# Patient Record
Sex: Male | Born: 1950 | Hispanic: Yes | Smoking: Former smoker
Health system: Southern US, Community
[De-identification: ages and names within clinical notes are randomized; demographics above are authoritative.]

## PROBLEM LIST (undated history)

## (undated) DIAGNOSIS — I639 Cerebral infarction, unspecified: Secondary | ICD-10-CM

## (undated) DIAGNOSIS — R51 Headache: Secondary | ICD-10-CM

## (undated) DIAGNOSIS — I2510 Atherosclerotic heart disease of native coronary artery without angina pectoris: Secondary | ICD-10-CM

## (undated) DIAGNOSIS — E119 Type 2 diabetes mellitus without complications: Secondary | ICD-10-CM

## (undated) DIAGNOSIS — I10 Essential (primary) hypertension: Secondary | ICD-10-CM

## (undated) DIAGNOSIS — E7800 Pure hypercholesterolemia, unspecified: Secondary | ICD-10-CM

## (undated) DIAGNOSIS — R519 Headache, unspecified: Secondary | ICD-10-CM

## (undated) DIAGNOSIS — N186 End stage renal disease: Secondary | ICD-10-CM

## (undated) DIAGNOSIS — K219 Gastro-esophageal reflux disease without esophagitis: Secondary | ICD-10-CM

## (undated) DIAGNOSIS — Z992 Dependence on renal dialysis: Secondary | ICD-10-CM

## (undated) HISTORY — PX: EYE SURGERY: SHX253

## (undated) HISTORY — PX: LIGATION OF ARTERIOVENOUS  FISTULA: SHX5948

## (undated) HISTORY — PX: APPENDECTOMY: SHX54

## (undated) HISTORY — PX: FINGER AMPUTATION: SHX636

## (undated) HISTORY — PX: AV FISTULA PLACEMENT: SHX1204

---

## 2013-07-10 HISTORY — PX: CARDIAC CATHETERIZATION: SHX172

## 2014-10-09 HISTORY — PX: CORONARY ARTERY BYPASS GRAFT: SHX141

## 2016-07-10 HISTORY — PX: INSERTION OF DIALYSIS CATHETER: SHX1324

## 2016-12-08 DIAGNOSIS — I639 Cerebral infarction, unspecified: Secondary | ICD-10-CM

## 2016-12-08 HISTORY — DX: Cerebral infarction, unspecified: I63.9

## 2018-03-04 ENCOUNTER — Encounter (HOSPITAL_COMMUNITY): Payer: Self-pay | Admitting: Emergency Medicine

## 2018-03-04 ENCOUNTER — Inpatient Hospital Stay (HOSPITAL_COMMUNITY)
Admission: EM | Admit: 2018-03-04 | Discharge: 2018-03-07 | DRG: 205 | Disposition: A | Discharge status: Home / Self Care | Payer: Medicare Other | Attending: Internal Medicine | Admitting: Internal Medicine

## 2018-03-04 ENCOUNTER — Emergency Department (HOSPITAL_COMMUNITY): Payer: Medicare Other

## 2018-03-04 ENCOUNTER — Other Ambulatory Visit: Payer: Self-pay

## 2018-03-04 DIAGNOSIS — G4733 Obstructive sleep apnea (adult) (pediatric): Secondary | ICD-10-CM | POA: Diagnosis present

## 2018-03-04 DIAGNOSIS — E1151 Type 2 diabetes mellitus with diabetic peripheral angiopathy without gangrene: Secondary | ICD-10-CM | POA: Diagnosis present

## 2018-03-04 DIAGNOSIS — L97529 Non-pressure chronic ulcer of other part of left foot with unspecified severity: Secondary | ICD-10-CM | POA: Diagnosis present

## 2018-03-04 DIAGNOSIS — L8990 Pressure ulcer of unspecified site, unspecified stage: Secondary | ICD-10-CM

## 2018-03-04 DIAGNOSIS — M940 Chondrocostal junction syndrome [Tietze]: Principal | ICD-10-CM | POA: Diagnosis present

## 2018-03-04 DIAGNOSIS — E1142 Type 2 diabetes mellitus with diabetic polyneuropathy: Secondary | ICD-10-CM | POA: Diagnosis present

## 2018-03-04 DIAGNOSIS — E11649 Type 2 diabetes mellitus with hypoglycemia without coma: Secondary | ICD-10-CM | POA: Diagnosis not present

## 2018-03-04 DIAGNOSIS — Z794 Long term (current) use of insulin: Secondary | ICD-10-CM

## 2018-03-04 DIAGNOSIS — I120 Hypertensive chronic kidney disease with stage 5 chronic kidney disease or end stage renal disease: Secondary | ICD-10-CM | POA: Diagnosis present

## 2018-03-04 DIAGNOSIS — D631 Anemia in chronic kidney disease: Secondary | ICD-10-CM | POA: Diagnosis present

## 2018-03-04 DIAGNOSIS — Z951 Presence of aortocoronary bypass graft: Secondary | ICD-10-CM

## 2018-03-04 DIAGNOSIS — R072 Precordial pain: Secondary | ICD-10-CM

## 2018-03-04 DIAGNOSIS — L97519 Non-pressure chronic ulcer of other part of right foot with unspecified severity: Secondary | ICD-10-CM | POA: Diagnosis present

## 2018-03-04 DIAGNOSIS — I2510 Atherosclerotic heart disease of native coronary artery without angina pectoris: Secondary | ICD-10-CM | POA: Diagnosis present

## 2018-03-04 DIAGNOSIS — K5900 Constipation, unspecified: Secondary | ICD-10-CM | POA: Diagnosis present

## 2018-03-04 DIAGNOSIS — I70219 Atherosclerosis of native arteries of extremities with intermittent claudication, unspecified extremity: Secondary | ICD-10-CM

## 2018-03-04 DIAGNOSIS — N186 End stage renal disease: Secondary | ICD-10-CM | POA: Diagnosis present

## 2018-03-04 DIAGNOSIS — E1122 Type 2 diabetes mellitus with diabetic chronic kidney disease: Secondary | ICD-10-CM | POA: Diagnosis present

## 2018-03-04 DIAGNOSIS — Z87891 Personal history of nicotine dependence: Secondary | ICD-10-CM

## 2018-03-04 DIAGNOSIS — E11621 Type 2 diabetes mellitus with foot ulcer: Secondary | ICD-10-CM | POA: Diagnosis present

## 2018-03-04 DIAGNOSIS — Z992 Dependence on renal dialysis: Secondary | ICD-10-CM

## 2018-03-04 DIAGNOSIS — R296 Repeated falls: Secondary | ICD-10-CM | POA: Diagnosis present

## 2018-03-04 DIAGNOSIS — N2581 Secondary hyperparathyroidism of renal origin: Secondary | ICD-10-CM | POA: Diagnosis present

## 2018-03-04 DIAGNOSIS — Z79899 Other long term (current) drug therapy: Secondary | ICD-10-CM

## 2018-03-04 DIAGNOSIS — R0789 Other chest pain: Secondary | ICD-10-CM

## 2018-03-04 HISTORY — DX: End stage renal disease: N18.6

## 2018-03-04 HISTORY — DX: Essential (primary) hypertension: I10

## 2018-03-04 HISTORY — DX: Headache, unspecified: R51.9

## 2018-03-04 HISTORY — DX: Atherosclerotic heart disease of native coronary artery without angina pectoris: I25.10

## 2018-03-04 HISTORY — DX: Gastro-esophageal reflux disease without esophagitis: K21.9

## 2018-03-04 HISTORY — DX: Dependence on renal dialysis: Z99.2

## 2018-03-04 HISTORY — DX: Headache: R51

## 2018-03-04 HISTORY — DX: Pure hypercholesterolemia, unspecified: E78.00

## 2018-03-04 HISTORY — DX: Cerebral infarction, unspecified: I63.9

## 2018-03-04 HISTORY — DX: Type 2 diabetes mellitus without complications: E11.9

## 2018-03-04 LAB — CBC WITH DIFFERENTIAL/PLATELET
Abs Immature Granulocytes: 0.1 10*3/uL (ref 0.0–0.1)
Basophils Absolute: 0.1 10*3/uL (ref 0.0–0.1)
Basophils Relative: 1 %
EOS ABS: 0.2 10*3/uL (ref 0.0–0.7)
EOS PCT: 3 %
HCT: 36.8 % — ABNORMAL LOW (ref 39.0–52.0)
Hemoglobin: 10.7 g/dL — ABNORMAL LOW (ref 13.0–17.0)
IMMATURE GRANULOCYTES: 1 %
Lymphocytes Relative: 16 %
Lymphs Abs: 1.1 10*3/uL (ref 0.7–4.0)
MCH: 25.2 pg — ABNORMAL LOW (ref 26.0–34.0)
MCHC: 29.1 g/dL — AB (ref 30.0–36.0)
MCV: 86.8 fL (ref 78.0–100.0)
MONOS PCT: 10 %
Monocytes Absolute: 0.7 10*3/uL (ref 0.1–1.0)
NEUTROS PCT: 69 %
Neutro Abs: 4.6 10*3/uL (ref 1.7–7.7)
PLATELETS: 196 10*3/uL (ref 150–400)
RBC: 4.24 MIL/uL (ref 4.22–5.81)
RDW: 20.5 % — ABNORMAL HIGH (ref 11.5–15.5)
WBC: 6.6 10*3/uL (ref 4.0–10.5)

## 2018-03-04 LAB — BASIC METABOLIC PANEL
Anion gap: 20 — ABNORMAL HIGH (ref 5–15)
BUN: 53 mg/dL — ABNORMAL HIGH (ref 8–23)
CO2: 31 mmol/L (ref 22–32)
CREATININE: 8.72 mg/dL — AB (ref 0.61–1.24)
Calcium: 8.2 mg/dL — ABNORMAL LOW (ref 8.9–10.3)
Chloride: 87 mmol/L — ABNORMAL LOW (ref 98–111)
GFR calc non Af Amer: 6 mL/min — ABNORMAL LOW (ref >60)
GFR, EST AFRICAN AMERICAN: 6 mL/min — AB (ref >60)
Glucose, Bld: 186 mg/dL — ABNORMAL HIGH (ref 70–99)
Potassium: 4.8 mmol/L (ref 3.5–5.1)
SODIUM: 138 mmol/L (ref 135–145)

## 2018-03-04 LAB — I-STAT TROPONIN, ED
TROPONIN I, POC: 0.03 ng/mL (ref 0.00–0.08)
Troponin i, poc: 0.01 ng/mL (ref 0.00–0.08)

## 2018-03-04 MED ORDER — CINACALCET HCL 30 MG PO TABS
90.0000 mg | ORAL_TABLET | Freq: Every day | ORAL | Status: DC
  Administered 2018-03-05 – 2018-03-06 (×2): 90 mg via ORAL
  Filled 2018-03-04 (×3): qty 3

## 2018-03-04 MED ORDER — HYDRALAZINE HCL 25 MG PO TABS
25.0000 mg | ORAL_TABLET | Freq: Three times a day (TID) | ORAL | Status: DC
  Administered 2018-03-05 – 2018-03-07 (×8): 25 mg via ORAL
  Filled 2018-03-04 (×9): qty 1

## 2018-03-04 MED ORDER — ATORVASTATIN CALCIUM 20 MG PO TABS: 80.0000 mg | ORAL_TABLET | Freq: Every day | ORAL | Status: DC

## 2018-03-04 MED ORDER — CARVEDILOL 12.5 MG PO TABS
6.2500 mg | ORAL_TABLET | Freq: Two times a day (BID) | ORAL | Status: DC
  Administered 2018-03-05: 6.25 mg via ORAL
  Filled 2018-03-04: qty 1

## 2018-03-04 MED ORDER — ASPIRIN 81 MG PO CHEW
324.0000 mg | CHEWABLE_TABLET | Freq: Once | ORAL | Status: AC
  Administered 2018-03-04: 324 mg via ORAL
  Filled 2018-03-04: qty 4

## 2018-03-04 MED ORDER — SEVELAMER CARBONATE 800 MG PO TABS
2400.0000 mg | ORAL_TABLET | Freq: Three times a day (TID) | ORAL | Status: DC
  Administered 2018-03-05 – 2018-03-07 (×7): 2400 mg via ORAL
  Filled 2018-03-04 (×8): qty 3

## 2018-03-04 NOTE — ED Provider Notes (Signed)
Patient placed in Quick Look pathway, seen and evaluated   Chief Complaint: Chest pain, generalized weakness, fatigue, shortness of breath  HPI:   67 y.o. M who presents for evaluation of multiple complaints, including generalized weakness, shortness of breath, chest pain that is been ongoing for last 2 weeks.  Family reports that symptoms have worsened over the last few days.  They report that he just feels tired and "not feeling well."  Patient is a Tuesday, Thursday, Saturday dialysis patient.  He reports his last dialysis was 2 days ago.  No issues.  Patient also reports nausea/vomiting.  Denies any abdominal pain.  He does not make urine.  Denies any fevers, chills.  ROS: Pain, generalized weakness, fatigue, shortness of breath.  Physical Exam:   Gen: No distress  Neuro: Awake and Alert  Skin: Warm    Focused Exam: Regular rate and rhythm, fine crackles noted.  Abdomen is soft, nondistended, nontender.   Initiation of care has begun. The patient has been counseled on the process, plan, and necessity for staying for the completion/evaluation, and the remainder of the medical screening examination    Maxwell CaulLayden, Lindsey A, PA-C 03/04/18 1845    Rolan BuccoBelfi, Melanie, MD 03/04/18 1919

## 2018-03-04 NOTE — ED Provider Notes (Signed)
Patient is a 67 year old male with significant cardiac risk factors who presents with chest pain.  He has chronic pain but his symptoms today are worse than his baseline pain.  His EKG shows some T wave inversion laterally.  We have no old EKG for comparison.  Consult has been made with the cardiology fellow who recommends medicine admission.  I saw and evaluated the patient, reviewed the resident's note and I agree with the findings and plan.  EKG: EKG Interpretation  Date/Time:  Monday March 04 2018 18:34:32 EDT Ventricular Rate:  59 PR Interval:  136 QRS Duration: 82 QT Interval:  478 QTC Calculation: 473 R Axis:   11 Text Interpretation:  Sinus bradycardia ST & T wave abnormality, consider lateral ischemia Abnormal ECG Confirmed by Rolan BuccoBelfi, Tamas Suen [phone]([id number]) on 03/04/2018 9:23:51 PM     Rolan BuccoBelfi, Emmett Arntz, MD 03/04/18 2255

## 2018-03-04 NOTE — ED Triage Notes (Signed)
Patient to ED c/o generalized weakness, shortness of breath, and non-radiating L-sided CP x 2 weeks, progressively worsening since onset. Pt hx ESRD on dialysis T/TH/Sa (last had on Saturday with no problems), open heart surgery 4 years ago, HTN, DM. Also endorsing N/V. Denies fevers/chills.

## 2018-03-04 NOTE — ED Provider Notes (Signed)
[facility] EMERGENCY DEPARTMENT Provider Note   CSN: [id number] Arrival date & time: 03/04/18  1824     History   Chief Complaint Chief Complaint  Patient presents with  . Chest Pain  . Fatigue    HPI Todd Gill is a 67 y.o. male.  The history is provided by the patient. The history is limited by a language barrier. A language interpreter was used Banker interpreter: Trinna Post).  Chest Pain   This is a chronic problem. Episode onset: 2 months ago. The problem occurs constantly. The problem has been rapidly worsening (over last day). The pain is severe. Quality: pounding. The pain does not radiate. Duration of episode(s) is 1 day. Associated symptoms include nausea and shortness of breath. Pertinent negatives include no abdominal pain, no cough, no fever, no palpitations, no sputum production and no vomiting. Treatments tried: ultram. The treatment provided no relief.  His past medical history is significant for CAD (s/p CABG in 2015) and diabetes.    Past Medical History:  Diagnosis Date  . Diabetes mellitus without complication (HCC)   . End stage renal disease on dialysis (HCC)   . Hypertension     Patient Active Problem List   Diagnosis Date Noted  . Chest pain 03/04/2018    Past Surgical History:  Procedure Laterality Date  . CARDIAC SURGERY  2015   open heart surgery  . HAND SURGERY          Home Medications    Prior to Admission medications   Medication Sig Start Date End Date Taking? Authorizing Provider  atorvastatin (LIPITOR) 80 MG tablet Take 80 mg by mouth daily. 12/24/17  Yes [provider]  carvedilol (COREG) 6.25 MG tablet Take 6.25 mg by mouth 2 (two) times daily. 12/24/17  Yes [provider]  cinacalcet (SENSIPAR) 90 MG tablet Take 90 mg by mouth daily. 12/24/17  Yes [provider]  glipiZIDE (GLUCOTROL XL) 10 MG 24 hr tablet Take 10 mg by mouth daily. 12/24/17  Yes [provider]    hydrALAZINE (APRESOLINE) 25 MG tablet Take 25 mg by mouth 3 (three) times daily. 01/23/18  Yes [provider]  NOVOLIN N 100 UNIT/ML injection Inject 30 Units into the skin daily. 01/28/18  Yes [provider]  sevelamer carbonate (RENVELA) 800 MG tablet Take 2,400 mg by mouth 3 (three) times daily. 12/24/17 12/24/18 Yes [provider]    Family History No family history on file.  Social History Social History   Tobacco Use  . Smoking status: Never Smoker  . Smokeless tobacco: Never Used  Substance Use Topics  . Alcohol use: Not Currently  . 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   Review of Systems Review of Systems  Constitutional: Negative for chills and fever.  HENT: Positive for congestion. Negative for sore throat.   Eyes: Negative for pain.  Respiratory: Positive for shortness of breath. Negative for cough and sputum production.   Cardiovascular: Positive for chest pain. Negative for palpitations.  Gastrointestinal: Positive for nausea. Negative for abdominal pain and vomiting.  Genitourinary: Negative for dysuria and hematuria.  Musculoskeletal:       Bilateral lower extremity pain  Skin: Negative for rash.  Neurological: Negative for syncope.  All other systems reviewed and are negative.    Physical Exam Updated Vital  Signs BP (!) 165/54 (BP Location: Right Arm)   Pulse (!) 56   Temp 97.6 F (36.4 C) (Oral)   Resp 17   SpO2 97%   Physical Exam  Constitutional: He is oriented to person, place, and time. He appears well-developed and well-nourished.  HENT:  Head: Normocephalic and atraumatic.  Eyes: Conjunctivae are normal.  Neck: Neck supple.  Cardiovascular: Normal rate and regular rhythm.  No murmur heard. Pulmonary/Chest: Effort normal and breath sounds normal. No respiratory distress. He has no decreased  breath sounds.  Abdominal: Soft. There is no tenderness.  Musculoskeletal: He exhibits no edema.       Right lower leg: Normal.       Left lower leg: Normal.  Neurological: He is alert and oriented to person, place, and time.  Skin: Skin is warm and dry.  Psychiatric: He has a normal mood and affect.  Nursing note and vitals reviewed.    ED Treatments / Results  Labs (all labs ordered are listed, but only abnormal results are displayed) Labs Reviewed  BASIC METABOLIC PANEL - Abnormal; Notable for the following components:      Result Value   Chloride 87 (*)    Glucose, Bld 186 (*)    BUN 53 (*)    Creatinine, Ser 8.72 (*)    Calcium 8.2 (*)    GFR calc non Af Amer 6 (*)    GFR calc Af Amer 6 (*)    Anion gap 20 (*)    All other components within normal limits  CBC WITH DIFFERENTIAL/PLATELET - Abnormal; Notable for the following components:   Hemoglobin 10.7 (*)    HCT 36.8 (*)    MCH 25.2 (*)    MCHC 29.1 (*)    RDW 20.5 (*)    All other components within normal limits  TROPONIN I - Abnormal; Notable for the following components:   Troponin I 0.03 (*)    All other components within normal limits  CBG MONITORING, ED - Abnormal; Notable for the following components:   Glucose-Capillary 45 (*)    All other components within normal limits  CBG MONITORING, ED - Abnormal; Notable for the following components:   Glucose-Capillary 56 (*)    All other components within normal limits  HIV ANTIBODY (ROUTINE TESTING)  TROPONIN I  TROPONIN I  CK  HEPATIC FUNCTION PANEL  TSH  LIPID PANEL  HEMOGLOBIN A1C  I-STAT TROPONIN, ED  I-STAT TROPONIN, ED    EKG EKG Interpretation  Date/Time:  Monday March 04 2018 18:34:32 EDT Ventricular Rate:  59 PR Interval:  136 QRS Duration: 82 QT Interval:  478 QTC Calculation: 473 R Axis:   11 Text Interpretation:  Sinus bradycardia ST & T wave abnormality, consider lateral ischemia Abnormal ECG Confirmed by Rolan BuccoBelfi, Melanie [phone]([id number]) on  03/04/2018 9:23:51 PM   Radiology Dg Chest 2 View  Result Date: 03/04/2018 CLINICAL DATA:  Generalized weakness, shortness of breath and nonradiating left chest pain for the past 2 weeks, progressively worsening. EXAM: CHEST - 2 VIEW COMPARISON:  None. FINDINGS: Mildly enlarged cardiac silhouette. Post CABG changes. Left jugular double-lumen catheter tips in the right atrium. No pneumothorax. Clear lungs with mildly prominent pulmonary vasculature and interstitial markings. No pleural fluid. Cholecystectomy clips. IMPRESSION: 1. Cardiomegaly and mild pulmonary vascular congestion. 2. Mild chronic interstitial lung disease with probable mild superimposed interstitial pulmonary edema. Electronically Signed   By: Beckie SaltsSteven  Reid M.D.   On: 03/04/2018 19:24    Procedures Procedures (including critical  care time)  Medications Ordered in ED Aspirin 324mg    Initial Impression / Assessment and Plan / ED Course  I have reviewed the triage vital signs and the nursing notes.  The patient is a 67 year old male with past medical history of CABG in 2015 who presents with chest pain.  The patient reports that the pain is a pounding pain similar to his previous heart attack.  The patient reports that he is tried tramadol for this and it has not helped.  Given that the pain is been present for the past 2 months without relief, do not suspect ACS, however patient reports that pain acutely worsened today when he woke up this morning.  EKG reveals no STEMI, however there are ST abnormalities and no EKG for comparison.  The patient's troponin is negative.  The patient endorses nausea, but he denies intractable vomiting or hematemesis.  Do not suspect esophageal perforation.  The patient denies shortness of breath, cough, congestion, fevers.  Do not suspect pneumonia.  Patient has equal pulses, denies tearing chest pain that radiates to his back, x-ray shows no widened mediastinum.  Do not suspect aortic  dissection.  Cardiology consulted and recommending admission for short period of observation due to his risk factors for cardiovascular disease.  Patient was admitted to internal medicine.  Care supervised by Dr. Fredderick Phenix.  Pertinent labs & imaging results that were available during my care of the patient were reviewed by me and considered in my medical decision making (see chart for details).     Final Clinical Impressions(s) / ED Diagnoses   Final diagnoses:  None    ED Discharge Orders    None       Nash Dimmer, MD 03/05/18 1610    Rolan Bucco, MD 03/12/18 1501

## 2018-03-04 NOTE — ED Notes (Signed)
ED Provider at bedside. 

## 2018-03-05 ENCOUNTER — Encounter (HOSPITAL_COMMUNITY): Payer: Self-pay | Admitting: General Practice

## 2018-03-05 DIAGNOSIS — Z992 Dependence on renal dialysis: Secondary | ICD-10-CM | POA: Diagnosis not present

## 2018-03-05 DIAGNOSIS — D631 Anemia in chronic kidney disease: Secondary | ICD-10-CM | POA: Diagnosis present

## 2018-03-05 DIAGNOSIS — Z9181 History of falling: Secondary | ICD-10-CM

## 2018-03-05 DIAGNOSIS — E11610 Type 2 diabetes mellitus with diabetic neuropathic arthropathy: Secondary | ICD-10-CM

## 2018-03-05 DIAGNOSIS — N2581 Secondary hyperparathyroidism of renal origin: Secondary | ICD-10-CM | POA: Diagnosis present

## 2018-03-05 DIAGNOSIS — G4733 Obstructive sleep apnea (adult) (pediatric): Secondary | ICD-10-CM

## 2018-03-05 DIAGNOSIS — Z79899 Other long term (current) drug therapy: Secondary | ICD-10-CM | POA: Diagnosis not present

## 2018-03-05 DIAGNOSIS — R5383 Other fatigue: Secondary | ICD-10-CM | POA: Diagnosis not present

## 2018-03-05 DIAGNOSIS — E1151 Type 2 diabetes mellitus with diabetic peripheral angiopathy without gangrene: Secondary | ICD-10-CM | POA: Diagnosis present

## 2018-03-05 DIAGNOSIS — Z89022 Acquired absence of left finger(s): Secondary | ICD-10-CM

## 2018-03-05 DIAGNOSIS — E1122 Type 2 diabetes mellitus with diabetic chronic kidney disease: Secondary | ICD-10-CM

## 2018-03-05 DIAGNOSIS — Z8249 Family history of ischemic heart disease and other diseases of the circulatory system: Secondary | ICD-10-CM

## 2018-03-05 DIAGNOSIS — I120 Hypertensive chronic kidney disease with stage 5 chronic kidney disease or end stage renal disease: Secondary | ICD-10-CM

## 2018-03-05 DIAGNOSIS — L97519 Non-pressure chronic ulcer of other part of right foot with unspecified severity: Secondary | ICD-10-CM | POA: Diagnosis present

## 2018-03-05 DIAGNOSIS — N186 End stage renal disease: Secondary | ICD-10-CM | POA: Diagnosis present

## 2018-03-05 DIAGNOSIS — K5900 Constipation, unspecified: Secondary | ICD-10-CM | POA: Diagnosis present

## 2018-03-05 DIAGNOSIS — E11621 Type 2 diabetes mellitus with foot ulcer: Secondary | ICD-10-CM | POA: Diagnosis present

## 2018-03-05 DIAGNOSIS — M940 Chondrocostal junction syndrome [Tietze]: Secondary | ICD-10-CM | POA: Diagnosis present

## 2018-03-05 DIAGNOSIS — L97529 Non-pressure chronic ulcer of other part of left foot with unspecified severity: Secondary | ICD-10-CM | POA: Diagnosis present

## 2018-03-05 DIAGNOSIS — R110 Nausea: Secondary | ICD-10-CM

## 2018-03-05 DIAGNOSIS — I2510 Atherosclerotic heart disease of native coronary artery without angina pectoris: Secondary | ICD-10-CM

## 2018-03-05 DIAGNOSIS — Z87891 Personal history of nicotine dependence: Secondary | ICD-10-CM

## 2018-03-05 DIAGNOSIS — D649 Anemia, unspecified: Secondary | ICD-10-CM

## 2018-03-05 DIAGNOSIS — R531 Weakness: Secondary | ICD-10-CM | POA: Diagnosis not present

## 2018-03-05 DIAGNOSIS — R0789 Other chest pain: Secondary | ICD-10-CM

## 2018-03-05 DIAGNOSIS — Z794 Long term (current) use of insulin: Secondary | ICD-10-CM | POA: Diagnosis not present

## 2018-03-05 DIAGNOSIS — E11649 Type 2 diabetes mellitus with hypoglycemia without coma: Secondary | ICD-10-CM | POA: Diagnosis not present

## 2018-03-05 DIAGNOSIS — R296 Repeated falls: Secondary | ICD-10-CM | POA: Diagnosis present

## 2018-03-05 DIAGNOSIS — Z951 Presence of aortocoronary bypass graft: Secondary | ICD-10-CM

## 2018-03-05 DIAGNOSIS — I70219 Atherosclerosis of native arteries of extremities with intermittent claudication, unspecified extremity: Secondary | ICD-10-CM

## 2018-03-05 DIAGNOSIS — E1142 Type 2 diabetes mellitus with diabetic polyneuropathy: Secondary | ICD-10-CM | POA: Diagnosis present

## 2018-03-05 DIAGNOSIS — Z841 Family history of disorders of kidney and ureter: Secondary | ICD-10-CM

## 2018-03-05 DIAGNOSIS — L8990 Pressure ulcer of unspecified site, unspecified stage: Secondary | ICD-10-CM

## 2018-03-05 DIAGNOSIS — R0981 Nasal congestion: Secondary | ICD-10-CM

## 2018-03-05 LAB — RENAL FUNCTION PANEL
ALBUMIN: 2.6 g/dL — AB (ref 3.5–5.0)
ANION GAP: 21 — AB (ref 5–15)
BUN: 63 mg/dL — ABNORMAL HIGH (ref 8–23)
CALCIUM: 7.7 mg/dL — AB (ref 8.9–10.3)
CO2: 28 mmol/L (ref 22–32)
Chloride: 86 mmol/L — ABNORMAL LOW (ref 98–111)
Creatinine, Ser: 9.77 mg/dL — ABNORMAL HIGH (ref 0.61–1.24)
GFR, EST AFRICAN AMERICAN: 6 mL/min — AB (ref >60)
GFR, EST NON AFRICAN AMERICAN: 5 mL/min — AB (ref >60)
GLUCOSE: 190 mg/dL — AB (ref 70–99)
PHOSPHORUS: 4.3 mg/dL (ref 2.5–4.6)
POTASSIUM: 4.8 mmol/L (ref 3.5–5.1)
SODIUM: 135 mmol/L (ref 135–145)

## 2018-03-05 LAB — GLUCOSE, CAPILLARY
GLUCOSE-CAPILLARY: 124 mg/dL — AB (ref 70–99)
GLUCOSE-CAPILLARY: 178 mg/dL — AB (ref 70–99)
GLUCOSE-CAPILLARY: 109 mg/dL — AB (ref 70–99)
GLUCOSE-CAPILLARY: 113 mg/dL — AB (ref 70–99)
GLUCOSE-CAPILLARY: 335 mg/dL — AB (ref 70–99)
Glucose-Capillary: 73 mg/dL (ref 70–99)
Glucose-Capillary: 184 mg/dL — ABNORMAL HIGH (ref 70–99)

## 2018-03-05 LAB — CBC
HEMATOCRIT: 36.3 % — AB (ref 39.0–52.0)
HEMOGLOBIN: 10.7 g/dL — AB (ref 13.0–17.0)
MCH: 25.1 pg — ABNORMAL LOW (ref 26.0–34.0)
MCHC: 29.5 g/dL — AB (ref 30.0–36.0)
MCV: 85.2 fL (ref 78.0–100.0)
Platelets: 198 10*3/uL (ref 150–400)
RBC: 4.26 MIL/uL (ref 4.22–5.81)
RDW: 21 % — ABNORMAL HIGH (ref 11.5–15.5)
WBC: 8.2 10*3/uL (ref 4.0–10.5)

## 2018-03-05 LAB — LIPID PANEL
CHOL/HDL RATIO: 2.9 ratio
CHOLESTEROL: 41 mg/dL (ref 0–200)
Cholesterol: 45 mg/dL (ref 0–200)
HDL: 14 mg/dL — AB (ref >40)
HDL: 11 mg/dL — ABNORMAL LOW (ref >40)
LDL Cholesterol: 1 mg/dL (ref 0–99)
TRIGLYCERIDES: 216 mg/dL — AB (ref <150)
Total CHOL/HDL Ratio: 4.1 RATIO
Triglycerides: 129 mg/dL (ref <150)
VLDL: 26 mg/dL (ref 0–40)
VLDL: 43 mg/dL — ABNORMAL HIGH (ref 0–40)

## 2018-03-05 LAB — HEPATITIS B SURFACE ANTIGEN: HEP B S AG: NEGATIVE

## 2018-03-05 LAB — HEPATIC FUNCTION PANEL
ALK PHOS: 152 U/L — AB (ref 38–126)
ALT: 17 U/L (ref 0–44)
AST: 17 U/L (ref 15–41)
Albumin: 2.7 g/dL — ABNORMAL LOW (ref 3.5–5.0)
Bilirubin, Direct: 0.1 mg/dL (ref 0.0–0.2)
Indirect Bilirubin: 0.7 mg/dL (ref 0.3–0.9)
TOTAL PROTEIN: 6.6 g/dL (ref 6.5–8.1)
Total Bilirubin: 0.8 mg/dL (ref 0.3–1.2)

## 2018-03-05 LAB — TROPONIN I
Troponin I: 0.03 ng/mL (ref <0.03)
Troponin I: 0.04 ng/mL (ref <0.03)
Troponin I: <0.03 ng/mL (ref <0.03)

## 2018-03-05 LAB — HEMOGLOBIN A1C
Hgb A1c MFr Bld: 7.4 % — ABNORMAL HIGH (ref 4.8–5.6)
Mean Plasma Glucose: 165.68 mg/dL

## 2018-03-05 LAB — CBG MONITORING, ED
GLUCOSE-CAPILLARY: 56 mg/dL — AB (ref 70–99)
Glucose-Capillary: 45 mg/dL — ABNORMAL LOW (ref 70–99)

## 2018-03-05 LAB — HIV ANTIBODY (ROUTINE TESTING W REFLEX): HIV SCREEN 4TH GENERATION: NONREACTIVE

## 2018-03-05 LAB — CK: CK TOTAL: 55 U/L (ref 49–397)

## 2018-03-05 LAB — TSH: TSH: 1.221 u[IU]/mL (ref 0.350–4.500)

## 2018-03-05 LAB — MRSA PCR SCREENING: MRSA BY PCR: NEGATIVE

## 2018-03-05 MED ORDER — SALINE SPRAY 0.65 % NA SOLN
1.0000 | NASAL | Status: DC | PRN
  Administered 2018-03-05: 1 via NASAL
  Filled 2018-03-05: qty 44

## 2018-03-05 MED ORDER — LIDOCAINE-PRILOCAINE 2.5-2.5 % EX CREA: 1.0000 "application " | TOPICAL_CREAM | CUTANEOUS | Status: DC | PRN

## 2018-03-05 MED ORDER — ACETAMINOPHEN 325 MG PO TABS
650.0000 mg | ORAL_TABLET | Freq: Four times a day (QID) | ORAL | Status: DC | PRN
  Administered 2018-03-05 – 2018-03-07 (×2): 650 mg via ORAL
  Filled 2018-03-05 (×3): qty 2

## 2018-03-05 MED ORDER — HEPARIN SODIUM (PORCINE) 5000 UNIT/ML IJ SOLN
5000.0000 [IU] | Freq: Three times a day (TID) | INTRAMUSCULAR | Status: DC
  Administered 2018-03-05 – 2018-03-07 (×6): 5000 [IU] via SUBCUTANEOUS
  Filled 2018-03-05 (×6): qty 1

## 2018-03-05 MED ORDER — POLYETHYLENE GLYCOL 3350 17 G PO PACK
17.0000 g | PACK | Freq: Every day | ORAL | Status: DC
  Administered 2018-03-06 – 2018-03-07 (×2): 17 g via ORAL
  Filled 2018-03-05 (×2): qty 1

## 2018-03-05 MED ORDER — SENNA 8.6 MG PO TABS
1.0000 | ORAL_TABLET | Freq: Every evening | ORAL | Status: DC | PRN
  Administered 2018-03-05: 8.6 mg via ORAL
  Filled 2018-03-05: qty 1

## 2018-03-05 MED ORDER — INSULIN ASPART 100 UNIT/ML ~~LOC~~ SOLN
0.0000 [IU] | Freq: Three times a day (TID) | SUBCUTANEOUS | Status: DC
  Administered 2018-03-06: 2 [IU] via SUBCUTANEOUS
  Administered 2018-03-06: 1 [IU] via SUBCUTANEOUS
  Administered 2018-03-07: 3 [IU] via SUBCUTANEOUS
  Administered 2018-03-07: 5 [IU] via SUBCUTANEOUS
  Administered 2018-03-07: 2 [IU] via SUBCUTANEOUS

## 2018-03-05 MED ORDER — SODIUM CHLORIDE 0.9 % IV SOLN: 100.0000 mL | INTRAVENOUS | Status: DC | PRN

## 2018-03-05 MED ORDER — CARVEDILOL 6.25 MG PO TABS
6.2500 mg | ORAL_TABLET | Freq: Two times a day (BID) | ORAL | Status: DC
  Administered 2018-03-06 – 2018-03-07 (×3): 6.25 mg via ORAL
  Filled 2018-03-05 (×3): qty 1

## 2018-03-05 MED ORDER — FLUTICASONE PROPIONATE 50 MCG/ACT NA SUSP
2.0000 | Freq: Every day | NASAL | Status: DC
  Administered 2018-03-07: 2 via NASAL
  Filled 2018-03-05: qty 16

## 2018-03-05 MED ORDER — HEPARIN SODIUM (PORCINE) 1000 UNIT/ML DIALYSIS: 1000.0000 [IU] | INTRAMUSCULAR | Status: DC | PRN

## 2018-03-05 MED ORDER — ALTEPLASE 2 MG IJ SOLR: 2.0000 mg | Freq: Once | INTRAMUSCULAR | Status: DC | PRN

## 2018-03-05 MED ORDER — PENTAFLUOROPROP-TETRAFLUOROETH EX AERO: 1.0000 "application " | INHALATION_SPRAY | CUTANEOUS | Status: DC | PRN

## 2018-03-05 MED ORDER — FLUTICASONE PROPIONATE 50 MCG/ACT NA SUSP
2.0000 | Freq: Once | NASAL | Status: AC
  Administered 2018-03-05: 2 via NASAL
  Filled 2018-03-05: qty 16

## 2018-03-05 MED ORDER — DICLOFENAC SODIUM 1 % TD GEL
2.0000 g | Freq: Four times a day (QID) | TRANSDERMAL | Status: DC
  Administered 2018-03-05 – 2018-03-06 (×4): 2 g via TOPICAL
  Filled 2018-03-05: qty 100

## 2018-03-05 MED ORDER — HEPARIN SODIUM (PORCINE) 1000 UNIT/ML DIALYSIS: 40.0000 [IU]/kg | INTRAMUSCULAR | Status: DC | PRN

## 2018-03-05 MED ORDER — DEXTROSE 50 % IV SOLN
25.0000 mL | Freq: Once | INTRAVENOUS | Status: AC
  Administered 2018-03-05: 25 mL via INTRAVENOUS
  Filled 2018-03-05: qty 50

## 2018-03-05 MED ORDER — ALUM & MAG HYDROXIDE-SIMETH 200-200-20 MG/5ML PO SUSP
30.0000 mL | Freq: Four times a day (QID) | ORAL | Status: DC | PRN
  Administered 2018-03-06: 30 mL via ORAL
  Filled 2018-03-05 (×2): qty 30

## 2018-03-05 MED ORDER — INSULIN ASPART 100 UNIT/ML ~~LOC~~ SOLN
0.0000 [IU] | Freq: Every day | SUBCUTANEOUS | Status: DC
  Administered 2018-03-05: 4 [IU] via SUBCUTANEOUS

## 2018-03-05 MED ORDER — LIDOCAINE HCL (PF) 1 % IJ SOLN: 5.0000 mL | INTRAMUSCULAR | Status: DC | PRN

## 2018-03-05 MED ORDER — NITROGLYCERIN 0.4 MG SL SUBL: 0.4000 mg | SUBLINGUAL_TABLET | SUBLINGUAL | Status: DC | PRN

## 2018-03-05 NOTE — Progress Notes (Signed)
Patient arrived to [facility] from the ED. Patient is Spanish speaking. His godson is at the bedside to assist with getting him transferred to the bed. CHG bath given. CCMD called and patient placed on telemetry. Patient oriented to room. Bed placed in the lowest position and call bell is within reach. Will continue to monitor. Victorino DecemberGarnet A Rey Dansby, RN

## 2018-03-05 NOTE — ED Notes (Signed)
Pt given orange juice.

## 2018-03-05 NOTE — Consult Note (Signed)
Reason for Consult: Continuity of ESRD care Referring Physician: Doneen PoissonLawrence Klima, MD ([facility])  HPI: (History obtained from patient's chart and godson-he speaks Spanish primarily). 67 year old man of Hispanic origin with past history significant for hypertension, diabetes mellitus, coronary artery disease status post CABG and end-stage renal disease on hemodialysis apparently for the past 12 years or so.  He [place] hemod[place] in [place], [place] and gets his health care through the [facility] there however, frustrated by some social barriers down in [place], he drove himself to his godson's house here in [place].  He presented with lower extremity weakness and fatigue for the past 2 weeks and some central chest pain that is exacerbated by movement and chest pressure as well as exertion.  He reports good compliance with his dialysis treatments and his godson feels that he may be getting fatigued with dialysis for the past several years.  He denies any fevers or chills and does not have any nausea, vomiting or diarrhea.  He has had previous attempts at permanent access placement but then required ligation of the fistula for steal symptoms with additional failures of alternate accesses.  Past Medical History:  Diagnosis Date  . Diabetes mellitus without complication (HCC)   . End stage renal disease on dialysis (HCC)   . Hypertension     Past Surgical History:  Procedure Laterality Date  . CARDIAC SURGERY  2015   open heart surgery  . HAND SURGERY      No family history on file.  Social History:  reports that he has never smoked. He has never used smokeless tobacco. He reports that he drank alcohol. He reports that he has current or past drug history.  Allergies: No Known Allergies  Medications:  Scheduled: . [START ON 03/06/2018] carvedilol  6.25 mg Oral BID  . cinacalcet  90 mg Oral Q breakfast  . diclofenac sodium  2 g Topical QID  . fluticasone  2 spray Each Nare Daily  . heparin   5,000 Units Subcutaneous Q8H  . hydrALAZINE  25 mg Oral TID  . insulin aspart  0-5 Units Subcutaneous QHS  . insulin aspart  0-9 Units Subcutaneous TID WC  . sevelamer carbonate  2,400 mg Oral TID WC    BMP Latest Ref Rng & Units 03/04/2018  Glucose 70 - 99 mg/dL 696(E186(H)  BUN 8 - 23 mg/dL 95(M53(H)  Creatinine 8.410.61 - 1.24 mg/dL 3.24(M8.72(H)  Sodium 010135 - 272145 mmol/L 138  Potassium 3.5 - 5.1 mmol/L 4.8  Chloride 98 - 111 mmol/L 87(L)  CO2 22 - 32 mmol/L 31  Calcium 8.9 - 10.3 mg/dL 8.2(L)   CBC Latest Ref Rng & Units 03/04/2018  WBC 4.0 - 10.5 K/uL 6.6  Hemoglobin 13.0 - 17.0 g/dL 10.7(L)  Hematocrit 39.0 - 52.0 % 36.8(L)  Platelets 150 - 400 K/uL 196   Dg Chest 2 View  Result Date: 03/04/2018 CLINICAL DATA:  Generalized weakness, shortness of breath and nonradiating left chest pain for the past 2 weeks, progressively worsening. EXAM: CHEST - 2 VIEW COMPARISON:  None. FINDINGS: Mildly enlarged cardiac silhouette. Post CABG changes. Left jugular double-lumen catheter tips in the right atrium. No pneumothorax. Clear lungs with mildly prominent pulmonary vasculature and interstitial markings. No pleural fluid. Cholecystectomy clips. IMPRESSION: 1. Cardiomegaly and mild pulmonary vascular congestion. 2. Mild chronic interstitial lung disease with probable mild superimposed interstitial pulmonary edema. Electronically Signed   By: Beckie SaltsSteven  Reid M.D.   On: 03/04/2018 19:24    Review of Systems  Constitutional:  Positive for malaise/fatigue. Negative for chills and fever.  HENT: Positive for congestion. Negative for hearing loss, sore throat and tinnitus.   Eyes: Negative.   Respiratory: Positive for shortness of breath. Negative for cough and hemoptysis.   Cardiovascular: Positive for chest pain and palpitations. Negative for orthopnea, claudication and leg swelling.  Gastrointestinal: Negative.   Genitourinary: Negative.   Musculoskeletal: Positive for back pain and myalgias.  Skin: Negative.    Neurological: Positive for focal weakness and weakness. Negative for headaches.       Weakness of bilateral LEs  Psychiatric/Behavioral: Positive for depression. The patient is nervous/anxious.    Blood pressure (!) 165/54, pulse (!) 56, temperature 97.6 F (36.4 C), temperature source Oral, resp. rate 17, height 5\' 6"  (1.676 m), weight 86.8 kg, SpO2 97 %. Physical Exam  Nursing note and vitals reviewed. Constitutional: He is oriented to person, place, and time. He appears well-developed and well-nourished. No distress.  HENT:  Head: Normocephalic and atraumatic.  Mouth/Throat: Oropharynx is clear and moist. No oropharyngeal exudate.  Eyes: Pupils are equal, round, and reactive to light. Conjunctivae are normal. No scleral icterus.  Neck: Normal range of motion. Neck supple. No JVD present. No thyromegaly present.  Cardiovascular: Regular rhythm and normal heart sounds.  No murmur heard. Regular bradycardia  Respiratory: Effort normal and breath sounds normal. He has no wheezes. He has no rales.  Left IJ TDC  GI: Soft. Bowel sounds are normal. There is no tenderness. There is no rebound.  Musculoskeletal: Normal range of motion. He exhibits no edema.  Scars from previous dialysis access placement sites noted  Neurological: He is alert and oriented to person, place, and time.  Skin: Skin is warm and dry. No erythema.  Psychiatric: He has a normal mood and affect.    Assessment/Plan: 1.  Chest pain: Atypical chest pain that appears to be more favorable for musculoskeletal origin rather than coronary etiology.  Ongoing additional work-up by the internal medicine teaching service. 2.  Generalized weakness: Suspected to be possibly associated with hypoglycemia and unclear whether he had had any intradialytic hypotension episodes.  On assessment by physical therapy here found to have significant lower extremity weakness requiring additional assistance and possibly skilled nursing facility  upon discharge. 3.  End-stage renal disease: Usually on Tuesday/Thursday/Saturday hemodialysis schedule-we will order for hemodialysis here and attempt to contact his dialysis unit for his outpatient records.  His godson expresses that may be the patient may need to transiently stay here in [place] for about 1-2 months for a "change of scenery" and then return back to [place] when he feels ready.  I have advised him to get in touch with the social worker at his home dialysis unit to try and transiently have him placed here in [place] at a local dialysis center. 4.  Hypertension: Resume oral antihypertensive therapy and monitor with hemodialysis. 5.  Anemia of chronic kidney disease: Hemoglobin level currently within acceptable range, monitor for need to start ESA. 6.  Secondary hyperparathyroidism: Resume phosphorus binders with meals and monitor calcium/phosphorus levels.  Will get records to verify VDRA versus calcimimetic dosing  Desia Saban K. 03/05/2018, 12:02 PM

## 2018-03-05 NOTE — Consult Note (Signed)
WOC Nurse wound consult note Evaluation completed in [facility] with the assistance of the [facility] nephew.  The patient is Spanish speaking only.  The nephew speaks [place] and [place]. Reason for Consult: Bilateral heel wounds Wound type: Diabetic foot ulcers, present for 4 years.  Per the patient the wounds have been much worse. POA: Yes Measurement: The right posterior heel eschar measures 2.2 cm x 1.2 cm; the eschar is stable, dry, there is no induration, no drainage.  The left posterior heel eschar measures 1 cm x 1 cm.  It is also dry, stable, without induration or drainage.    Both wounds area surrounded by heavy callus.   Plan of care:  Apply betadine each shift; allow to air dry.  Leave open to air. The patient could benefit from seeing a podiatrist post-discharge.  Monitor the wound area(s) for worsening of condition such as: Signs/symptoms of infection,  Increase in size,  Development of or worsening of odor, Development of pain, or increased pain at the affected locations.  Notify the medical team if any of these develop.  Thank you for the consult.  Discussed plan of care with the patient and bedside nurse.  WOC nurse will not follow at this time.  Please re-consult the WOC team if needed.  Helmut MusterSherry Bralen Wiltgen, RN, MSN, CWOCN, CNS-BC, pager [phone]

## 2018-03-05 NOTE — Progress Notes (Signed)
Received report from ED on patient coming to [facility]. Victorino DecemberGarnet A Elim Economou, RN
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
## 2018-03-05 NOTE — H&P (Signed)
Date: 03/05/2018               Patient Name:  Todd Gill MRN: [id number]  DOB: 22-Mar-1951 Age / Sex: 68 y.o., male   PCP: System, Pcp Not In         Medical Service: Internal Medicine Teaching Service         Attending Physician: Dr. Doneen Poisson, MD    First Contact: Dr. Karilyn Cota Pager: [phone]  Second Contact: Dr. Caron Presume Pager: [phone]       After Hours (After 5p/  First Contact Pager: [phone]  weekends / holidays): Second Contact Pager: [phone]   Chief Complaint: chest pain   History of Present Illness: History obtained with help of nephew present at bedside who served as Nurse, learning disability. Todd Gill is a 67 y/o Spanish speaking male with PMHx of insulin-dependent DM, ESRD on HD T/Th/S, HTN, CAD s/p CABG 2016 who presents with 2 weeks of progressive chest pain. The pain was gradual onset and steadily got worse until today which prompted his presentation to the ED. The pain is left sided, non-radiating and described as a pinching sensation. No palliating factors. Worse with cough, deep inspiration, exertion.   Patient also c/o generalized weakness and fatigue with recurrent falls at home the last few weeks. Endorses difficulty sleeping at night, orthopnea, headaches. He has no documented history of OSA. During our encounter, he fell asleep multiple times with noted apneic events.  Patient has been on HD for 11 years; last HD session was Saturday.   ROS + for blurry vision, nasal congestion, nausea, constipation. Negative for f/c, palpitations, abdominal pain, diarrhea, lower extremity swelling.  ED course: vitals on arrival were within normal limits, apart from mildly elevated BP at 161/63. BMET significant for BUN 53, crt 8.7, AG 20; CBC showed hgb 10.7, hct 36.8. Istat troponin 0.01.   Meds:  Current Meds  Medication Sig  . atorvastatin (LIPITOR) 80 MG tablet Take 80 mg by mouth daily.  . carvedilol (COREG) 6.25 MG tablet Take 6.25 mg by mouth 2 (two) times daily.  .  cinacalcet (SENSIPAR) 90 MG tablet Take 90 mg by mouth daily.  Marland Kitchen glipiZIDE (GLUCOTROL XL) 10 MG 24 hr tablet Take 10 mg by mouth daily.  . hydrALAZINE (APRESOLINE) 25 MG tablet Take 25 mg by mouth 3 (three) times daily.  Marland Kitchen NOVOLIN N 100 UNIT/ML injection Inject 30 Units into the skin daily.  . sevelamer carbonate (RENVELA) 800 MG tablet Take 2,400 mg by mouth 3 (three) times daily.     Allergies: Allergies as of 03/04/2018  . (No Known Allergies)   Past Medical History:  Diagnosis Date  . Diabetes mellitus without complication (HCC)   . End stage renal disease on dialysis (HCC)   . 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 His nephew, who is with him today, reports that Todd Gill is like a father to him. He eludes to a difficult social situation and would like for Todd Gill to come and live with him if possible. Former tobacco user (20 pack years); quit 30 years ago; no EtOH or illicit drugs.   Review of Systems: A complete ROS was negative except as per HPI.   Physical Exam: Blood pressure (!) 187/72, pulse 60,  temperature 98.6 F (37 C), temperature source Oral, resp. rate 16, SpO2 98 %. General: drowsy but arousable. A&Ox3; in NAD HEENT: normocephalic, atraumatic; significant periorbital edema; PEERL; oropharynx reveals prominent sublingual veins; crowded posterior airway; oral mucosa appears moist  Neck: large neck circumference  CV: RRR; no murmurs, rubs or gallops; distal pulses intact bilaterally Resp: no increased work of breathing; lungs CTAB; chest wall with TTP on the left, most prominently beneath left nipple  Abd: BS+; abdomen is obese; soft, non-tender Neuro: Cranial nerves grossly intact; no focal deficits; strength is 3/5 throughout; no decreased sensation in BLE  MSK: boney  deformity and soft tissue edema of left foot consistent with charcot foot; s/p 3rd and 4th digit amputations on left hand Skin: warm and dry; no evidence of rashes or skin breakdown  Psych: appropriate mood and affect   EKG: personally reviewed my interpretation is sinus bradycardia; non-specific ST changes with T wave inversion in lateral leads   CXR: personally reviewed my interpretation is technically adequate film with slight overpenetration, sternal wiring s/p CABG, dialysis catheter  appears to be in right atrium, silhouette sign indicating possible pulmonary edema; clear heart border on right; no blunting of costophrenic angles bilaterally   Assessment & Plan by Problem: Active Problems:   Chest pain 1. Chest pain - atypical; reproducible, pleuritic and persistent. Signs and symptoms most consistent with costochondritis. However, given his medical history and risk factors, will admit for ACS rule out. EKG with non-specific ST changes and T wave inversion in lateral leads, but no previous to compare to. First troponin negative. Patient and family do not report any recent cardiac cath or stress testing since a year after his CABG in 2016. History and work-up thus far not consistent with ACS, but calculated ASCVD score puts him at high risk of cardiac event in next 10 years (TIMI score 4, HEART score 5). I feel he would benefit from inpatient nuclear stress testing.  - trending troponins - continuous cardiac monitoring - repeat EKG in the am - have ordered nitroglycerin prn - lipid panel - A1C  2. Generalized weakness: patient complains of several weeks to months of poor sleep, orthopnea, headaches, and generalized fatigue and weakness to the point that he is not able to ambulate well. Based on physical exam and witnessing multiple apneic events throughout our encounter, he likely has OSA. Care everywhere shows a home sleep study in 2016, but results were not available. We will order CPAP  while he is here and see if this improves any of his symptoms. Will recommend outpatient sleep study, as I suspect he needs home CPAP. Have ordered TSH to evaluate other potential cause of fatigue. Patient has been on statin for many years so less likely weakness due to myositis, but have ordered CK level to rule out. Feel his findings on neuro exam are more consistent with lassitude versus true weakness. If patient found to have true muscle weakness secondary to deconditioning, may benefit from SNF placement for rehab.  - Will have PT/OT evaluate and treat while inpatient.   3. Anemia - likely anemia of chronic disease. Hgb ranges from 11-14 in care everywhere. There are no signs or symptoms of blood loss. Have ordered hepatic function panel to further evaluate. Will continue to trend CBCs   4. DM - patient is on glipizide and 30 units of NPH at home. He does report hypoglycemic events. Have ordered A1C to get better overall picture, but it is likely his regimen needs  to be decreased. Patient's blood glucose on admission Have placed him on sensitive sliding scale and will monitor CBGs closely.   5. ESRD on HD T/TH/S - no indication for emergent dialysis - consult nephrology for inpatient HD management - BUN more elevated than previous labs in care everywhere around 30-35 which would explain his anion gap metabolic acidosis and may also be contributing to generalized fatigue  - patient previously had access in LUE which was complicated by steal syndrome s/p left 3rd and 4th digit amputations and thus has catheter placed in left jugular.   Diet: Renal/CM DVT prophylaxis: heparin  Code: Full   Dispo: Admit patient to Inpatient with expected length of stay greater than 2 midnights.   SignedBridget Hartshorn: Shermika Balthaser D, DO 03/05/2018, 12:51 AM

## 2018-03-05 NOTE — Progress Notes (Signed)
RT communicated with Patient via [employer] video interpreter. Patient stated he does not wear CPAP at home and does not want to wear one tonight. RT will monitor patient as needed.

## 2018-03-05 NOTE — Progress Notes (Signed)
PT Cancellation Note  Patient Details Name: Todd Gill MRN: [id number] DOB: January 16, 1951   Cancelled Treatment:    Reason Eval/Treat Not Completed: Other (comment) Noted MD order had been changed to imminent DC now starting 03/05/18. RN reports patient is OK to see but requests that PT return a little later in the morning so he has a chance to eat and make sure his blood sugars are OK. Will attempt to return as time/schedule allow.    Nedra HaiKristen Unger PT, DPT, CBIS  Supplemental Physical Therapist [facility]   Pager [phone]

## 2018-03-05 NOTE — Progress Notes (Signed)
Received Mr Todd Gill from HD.  Awake and alert.  VSS.  HD cath intact to left chest caps checked and closed.

## 2018-03-05 NOTE — Discharge Summary (Signed)
Name: Todd Gill MRN: [id number] DOB: 25-Oct-1950 67 y.o. PCP: System, Pcp Not In  Date of Admission: 03/04/2018  6:27 PM Date of Discharge: 03/07/18 Attending Physician: Doneen PoissonKlima, Lawrence, MD  Discharge Diagnosis: 1. Costochondritis 2. Deconditioning  3. DM 4. ESRD on HD   Discharge Medications: Allergies as of 03/07/2018   No Known Allergies     Medication List    STOP taking these medications   glipiZIDE 10 MG 24 hr tablet Commonly known as:  GLUCOTROL XL   NOVOLIN N 100 UNIT/ML injection Generic drug:  insulin NPH Human     TAKE these medications   acetaminophen 325 MG tablet Commonly known as:  TYLENOL Take 2 tablets (650 mg total) by mouth every 6 (six) hours as needed for mild pain or moderate pain.   atorvastatin 40 MG tablet Commonly known as:  LIPITOR Take 1 tablet (40 mg total) by mouth daily. What changed:    medication strength  how much to take   carvedilol 6.25 MG tablet Commonly known as:  COREG Take 6.25 mg by mouth 2 (two) times daily.   cinacalcet 90 MG tablet Commonly known as:  SENSIPAR Take 90 mg by mouth daily.   fluticasone 50 MCG/ACT nasal spray Commonly known as:  FLONASE Place 2 sprays into both nostrils daily.   hydrALAZINE 25 MG tablet Commonly known as:  APRESOLINE Take 25 mg by mouth 3 (three) times daily.   linagliptin 5 MG Tabs tablet Commonly known as:  TRADJENTA Take 1 tablet (5 mg total) by mouth daily.   sevelamer carbonate 800 MG tablet Commonly known as:  RENVELA Take 2,400 mg by mouth 3 (three) times daily.   sodium chloride 0.65 % Soln nasal spray Commonly known as:  OCEAN Place 1 spray into both nostrils as needed for congestion.            Durable Medical Equipment  (From admission, onward)         Start     Ordered   03/06/18 1101  For home use only DME 3 n 1  Once     03/06/18 1100          Disposition and follow-up:   ToddGrantham C Gill [facility]  in Stable condition.  At the hospital follow up visit please address:  1.  DM: reassess medical management and blood glucose monitoring, if blood sugars remain high consider resuming insulin therapy  2.  Labs / imaging needed at time of follow-up: lipid panel, CBG  3.  Pending labs/ test needing follow-up: none  Follow-up Appointments: Follow-up Information    [facility] - Dme Follow up.   Why:  3n1 arranged- to be delivered to room prior to discharge Contact information: [address] [place] [address] [phone]        [facility], [facility]-Home Follow up.   Specialty:  Home Health Services Why:  HHRN/PT/OT arranged Contact information: [address] [place] [address] [phone]           Hospital Course by problem list: 1. Atypical chest pain- Patient presented with atypical chest pain, reproducible on exam. EKG unremarkable and troponin's trended, 0.03, 0.04 and <0.03. Likely musculoskeletal in nature. Provided tylenol for analgesia. 2. 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 PT evaluated and recommended SNF placement for rehab. Patient refused SNF; however, amenable  to HHS with PT/OT and RN with translating services. He plans to stay in [place] with his nephew and was instructed to contact the social worker at his HD center in [place] to help him find a temporary HD seat here in [place].  3. Diabetes: Presented with hypoglycemia, on glipizide and 30 units NPH. A1c 7.4. Adjusted insulin regimen to linagliptin 5 mg QD, and discontinued glipizide and NPH. Sugars were in the 200's on day of discharge. If he continues to have elevated sugars would recommend resuming insulin therapy. 4. ESRD on HD: Electrolytes were stable on admission, BUN 53, Cr 8.72. He had a HD session on 8/27. Repeat bmet with  worsened to BUN 63, Cr 9.77, AG 21. Euvolemic on exam. Received HD on 8/29 before discharge.   Discharge Vitals:   BP (!) 115/55 (BP Location: Right Arm)   Pulse 69   Temp 97.9 F (36.6 C) (Oral)   Resp 20   Ht 5\' 6"  (1.676 m)   Wt 85.3 kg   SpO2 100%   BMI 30.35 kg/m   Pertinent Labs, Studies, and Procedures:   Dg Chest 2 View  Result Date: 03/04/2018 CLINICAL DATA:  Generalized weakness, shortness of breath and nonradiating left chest pain for the past 2 weeks, progressively worsening. EXAM: CHEST - 2 VIEW COMPARISON:  None. FINDINGS: Mildly enlarged cardiac silhouette. Post CABG changes. Left jugular double-lumen catheter tips in the right atrium. No pneumothorax. Clear lungs with mildly prominent pulmonary vasculature and interstitial markings. No pleural fluid. Cholecystectomy clips. IMPRESSION: 1. Cardiomegaly and mild pulmonary vascular congestion. 2. Mild chronic interstitial lung disease with probable mild superimposed interstitial pulmonary edema. Electronically Signed   By: Beckie Salts M.D.   On: 03/04/2018 19:24   Discharge Instructions: Discharge Instructions    Diet - low sodium heart healthy   Complete by:  As directed    Diet - low sodium heart healthy   Complete by:  As directed    Discharge instructions   Complete by:  As directed    Todd Gill,  Please note the following changes to your medications:  -STOP taking atorvastatin 80 mg once a day  -START taking atorvastatin 40 mg once day  -Continue to take tylenol 650 mg every six hours as needed for chest pain -STOP taking novolin 30 units  -STOP taking glipizide 10 mg  -START taking Tradjenta 5 mg once a day   Follow up with your PCP as soon as possible. Thank you for allowing [place] to be a part of your care!   Discharge instructions   Complete by:  As directed    Todd Gill,  Please note the following changes to your medications:  -STOP taking glipizide (glucotrol xl) 10 mg  -STOP taking Novolin 30  units -START taking linagliptin (Tradjenta) 5 mg tablets once a day -STOP taking atorvastatin (Lipitor) 80 mg  -START taking atorvastatin (Lipitor) 40 mg once day    Please follow up with your PCP as soon as possible. Thank you for allowing [place] to be a part of your care!   Discharge instructions   Complete by:  As directed    -Use Flonase nasal spray as needed, 2 sprays in both nostrils daily -Use Ocean nasal spray as needed, 1 spray in both nostrils as needed   Increase activity slowly   Complete by:  As directed    Increase activity slowly   Complete by:  As directed       Signed: Rehman, Areeg N, DO  03/07/2018, 1:23 PM   Pager: [phone]

## 2018-03-05 NOTE — Progress Notes (Signed)
PT Cancellation Note  Patient Details Name: Todd Gill MRN: [id number] DOB: 1950/12/11   Cancelled Treatment:    Reason Eval/Treat Not Completed: Patient declined, no reason specified Attempted to work with patient but he was still finishing breakfast; family present to translate and they request that PT return later in morning. Will attempt to return if time/schedule allow.    Nedra HaiKristen Carnesha Maravilla PT, DPT, CBIS  Supplemental Physical Therapist [facility]   Pager [phone]

## 2018-03-05 NOTE — Progress Notes (Addendum)
Internal Medicine Attending  Date: 03/05/2018  Patient name: Todd Gill Medical record number: [id number] Date of birth: 15-May-1951 Age: 67 y.o. Gender: male  I saw and evaluated the patient. I reviewed the resident's note by Dr. Karilyn Cotaehman and I agree with the resident's findings and plans as documented in her progress note.  Please see my H&P dated 03/05/2018 and attached to Dr. Murrell ReddenBloomfield's H&P dated 03/05/2018 for the specifics my evaluation, assessment, and plan from earlier in the day. The patient has been subsequently evaluated by physical therapy who found significant concerns related to ambulation and strength. The recommendation was a skilled nursing facility before returning home. This seems reasonable although with [place] hemodialysis occurring in [place] a nursing facility closer to his hemodialysis center will most likely be necessary were he to agree to skilled nursing facility care prior to returning home.  To clarify, the wounds on the heels bilaterally are diabetic foot ulcers, present for 4 years.

## 2018-03-05 NOTE — Procedures (Signed)
Patient seen on Hemodialysis. QB 400, UF goal 2L Resting comfortably- no changes to prescription at this time.   Zetta BillsJay Roneshia Drew MD [facility]. Office # [phone] Pager # [phone] 1:23 PM

## 2018-03-05 NOTE — Evaluation (Signed)
Physical Therapy Evaluation Patient Details Name: Todd Gill MRN: [id number] DOB: Apr 11, 1951 Today's Date: 03/05/2018   History of Present Illness  67yo male c/o 2 weeks of progressive chest pain, L sided and non-radiating, worse with coughing and deep breathing. Also concerned with weakness and recurrent falls. PMH DM, ESRD on HD, HTN, CABG 2015   Clinical Impression  Patient received sleeping in bed, easily woken and willing to participate in skilled PT session; family present and assisted with translation during session. He is able to complete functional bed mobility with S, however requires MinA for sit to stand, and MinA to ambulate approximately 74f with RW due to frequent buckling of knees and poor safety awareness. Patient very fatigued and dizzy with gait, however unable to get accurate signal on pulse ox/dizziness had resolved prior to BP check. Will plan to closely monitor vitals during mobility next PT session. He became nauseous following gait and was left sitting up at the side of the bed with family present, all other needs met this morning. He will continue to benefit from skilled PT services in the acute setting as well as ongoing rehabilitation in the ST-SNF setting to reduce fall risk and continue to address functional deficits prior to return home.     Follow Up Recommendations SNF    Equipment Recommendations  Other (comment)(defer to next venue )    Recommendations for Other Services       Precautions / Restrictions Precautions Precautions: Fall;Other (comment) Precaution Comments: watch O2 and BP  Restrictions Weight Bearing Restrictions: No      Mobility  Bed Mobility Overal bed mobility: Needs Assistance Bed Mobility: Supine to Sit     Supine to sit: Supervision     General bed mobility comments: S for safety, no physical assist provided   Transfers Overall transfer level: Needs assistance Equipment used: Rolling walker (2 wheeled) Transfers: Sit  to/from Stand Sit to Stand: Min assist         General transfer comment: MinA to boost to full standing position, initial steadying   Ambulation/Gait Ambulation/Gait assistance: Min assist Gait Distance (Feet): 20 Feet Assistive device: Rolling walker (2 wheeled) Gait Pattern/deviations: Step-through pattern;Decreased step length - right;Decreased step length - left;Decreased stride length;Decreased dorsiflexion - right;Decreased dorsiflexion - left;Trendelenburg;Trunk flexed;Narrow base of support     General Gait Details: frequent buckling of B LEs with gait requiring MinA to maintain balance and safety, gait speed very slow for age   Stairs            Wheelchair Mobility    Modified Rankin (Stroke Patients Only)       Balance Overall balance assessment: Needs assistance Sitting-balance support: Bilateral upper extremity supported;Feet supported Sitting balance-Leahy Scale: Good     Standing balance support: Bilateral upper extremity supported;During functional activity Standing balance-Leahy Scale: Poor Standing balance comment: reliant on B UE support and external assistance to maintain balance                              Pertinent Vitals/Pain Pain Assessment: No/denies pain    Home Living Family/patient expects to be discharged to:: Private residence Living Arrangements: Other relatives Available Help at Discharge: Family;Available PRN/intermittently Type of Home: Apartment Home Access: Stairs to enter Entrance Stairs-Rails: None Entrance Stairs-Number of Steps: 3 Home Layout: One level Home Equipment: Walker - 2 wheels;Cane - single point      Prior Function Level of Independence: Independent with assistive  device(s)               Hand Dominance        Extremity/Trunk Assessment   Upper Extremity Assessment Upper Extremity Assessment: Defer to OT evaluation    Lower Extremity Assessment Lower Extremity Assessment:  Generalized weakness    Cervical / Trunk Assessment Cervical / Trunk Assessment: Normal  Communication   Communication: Prefers language other than English(Spanish )  Cognition Arousal/Alertness: Awake/alert Behavior During Therapy: WFL for tasks assessed/performed Overall Cognitive Status: Within Functional Limits for tasks assessed                                        General Comments General comments (skin integrity, edema, etc.): became dizzy after gait; took awhile to get reasonable signal on SpO2 and dizziness had resolved prior to BP check. Need to watch these parameters next session as he may be desatting/be orthostatics     Exercises     Assessment/Plan    PT Assessment Patient needs continued PT services  PT Problem List Decreased strength;Decreased mobility;Decreased safety awareness;Decreased coordination;Obesity;Decreased activity tolerance;Decreased balance       PT Treatment Interventions      PT Goals (Current goals can be found in the Care Plan section)  Acute Rehab PT Goals Patient Stated Goal: to get stronger, go home  PT Goal Formulation: With patient/family Time For Goal Achievement: 03/19/18 Potential to Achieve Goals: Fair    Frequency Min 2X/week   Barriers to discharge        Co-evaluation               AM-PAC PT "6 Clicks" Daily Activity  Outcome Measure Difficulty turning over in bed (including adjusting bedclothes, sheets and blankets)?: None Difficulty moving from lying on back to sitting on the side of the bed? : None Difficulty sitting down on and standing up from a chair with arms (e.g., wheelchair, bedside commode, etc,.)?: A Little Help needed moving to and from a bed to chair (including a wheelchair)?: A Little Help needed walking in hospital room?: A Lot Help needed climbing 3-5 steps with a railing? : Total 6 Click Score: 17    End of Session Equipment Utilized During Treatment: Gait belt Activity  Tolerance: Patient limited by fatigue Patient left: in bed;with call bell/phone within reach;with family/visitor present   PT Visit Diagnosis: Unsteadiness on feet (R26.81);Muscle weakness (generalized) (M62.81);History of falling (Z91.81);Difficulty in walking, not elsewhere classified (R26.2)    Time: 1010-1036 PT Time Calculation (min) (ACUTE ONLY): 26 min   Charges:   PT Evaluation $PT Eval Low Complexity: 1 Low PT Treatments $Gait Training: 8-22 mins        Deniece Ree PT, DPT, CBIS  Supplemental Physical Therapist [facility]   Pager [phone]

## 2018-03-05 NOTE — Progress Notes (Signed)
Patient reports pain running down his left arm from his heart. Asked the NT to get an EKG. EKG showed sinus bradycardia and prolonged QT. During EKG patient said that his arm doesn't hurt anymore. Will continue to monitor. Victorino DecemberGarnet A Demmi Sindt, RN

## 2018-03-06 LAB — GLUCOSE, CAPILLARY
GLUCOSE-CAPILLARY: 141 mg/dL — AB (ref 70–99)
Glucose-Capillary: 116 mg/dL — ABNORMAL HIGH (ref 70–99)
Glucose-Capillary: 183 mg/dL — ABNORMAL HIGH (ref 70–99)
Glucose-Capillary: 200 mg/dL — ABNORMAL HIGH (ref 70–99)

## 2018-03-06 MED ORDER — DOXERCALCIFEROL 4 MCG/2ML IV SOLN
4.0000 ug | INTRAVENOUS | Status: DC
  Administered 2018-03-07: 4 ug via INTRAVENOUS

## 2018-03-06 NOTE — Evaluation (Signed)
Occupational Therapy Evaluation Patient Details Name: Todd Gill MRN: [id number] DOB: [birth year] Today's Date: 03/06/2018    History of Present Illness 67yo male c/o 2 weeks of progressive chest pain, L sided and non-radiating, worse with coughing and deep breathing. Also concerned with weakness and recurrent falls. PMH DM, ESRD on HD, HTN, CABG 2015    Clinical Impression   PTA, pt was living with his son and was performing ADLs and IADLs. Pt reporting he has had two recent falls where his "legs pass out". Pt also reporting he has difficulty performing IADLs (I.e. making meals) due to fatigue and falling. Pt currently requiring Min A for LB ADLs and functional mobility due to decreased balance and safety. Pt requiring seated rest breaks throughout ADLs. Pt presenting with decreased strength, balance, and activity tolerance. Pt would benefit from further acute OT to facilitate safe dc. Recommend dc to SNF for ST-rehab and further OT to optimize safety, independence with ADLs, and return to PLOF.      Follow Up Recommendations  SNF;Supervision/Assistance - 24 hour    Equipment Recommendations  3 in 1 bedside commode;Other (comment)(Pt reporting he feels more safe with rollator)    Recommendations for Other Services PT consult     Precautions / Restrictions Precautions Precautions: Fall;Other (comment) Precaution Comments: watch O2 and BP  Restrictions Weight Bearing Restrictions: No      Mobility Bed Mobility               General bed mobility comments: OOB with NT going ot bathroom for bathe  Transfers Overall transfer level: Needs assistance Equipment used: Rolling walker (2 wheeled) Transfers: Sit to/from Stand Sit to Stand: Min assist         General transfer comment: Min A for power up and safety. Pt with decreased balance    Balance Overall balance assessment: Needs assistance Sitting-balance support: Bilateral upper extremity supported;Feet  supported Sitting balance-Leahy Scale: Good     Standing balance support: Bilateral upper extremity supported;During functional activity Standing balance-Leahy Scale: Poor Standing balance comment: Reliant on UE support and physical A                           ADL either performed or assessed with clinical judgement   ADL Overall ADL's : Needs assistance/impaired Eating/Feeding: Set up;Sitting   Grooming: Min guard;Standing Grooming Details (indicate cue type and reason): Min Guard A for safety with static standing Upper Body Bathing: Min guard;Sitting   Lower Body Bathing: Minimal assistance;Sit to/from stand Lower Body Bathing Details (indicate cue type and reason): Min A for safety with dynamic stanidng and presenting with posterior lean. single LOB requiring Min A for correction during LB bathing Upper Body Dressing : Min guard;Sitting   Lower Body Dressing: Minimal assistance;Sit to/from stand Lower Body Dressing Details (indicate cue type and reason): Min A for standing balance. Pt able to don socks with increased time and effort. Toilet Transfer: Minimal assistance;Ambulation;RW;BSC Toilet Transfer Details (indicate cue type and reason): Min A for safety     Tub/ Shower Transfer: Minimal assistance;Ambulation;3 in 1;Walk-in shower   Functional mobility during ADLs: Minimal assistance;Rolling walker General ADL Comments: Pt with decreased balance and fatigues quickly. 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 Onalee HuaDavid ID # [id number])   Cognition Arousal/Alertness: Awake/alert Behavior During Therapy: WFL for tasks assessed/performed Overall Cognitive Status: Within Functional Limits for tasks assessed                                     General Comments  Pt reporting he wants a rollator because he feels unsafe with RW and can't take sitting breaks    Exercises     Shoulder Instructions      Home Living Family/patient expects to be discharged to:: Private residence Living Arrangements: Children Available Help at Discharge: Family;Available PRN/intermittently Type of Home: Apartment Home Access: Stairs to enter Entrance Stairs-Number of Steps: 3 Entrance Stairs-Rails: None Home Layout: One level     Bathroom Shower/Tub: Chief Strategy OfficerTub/shower unit   Bathroom Toilet: Standard     Home Equipment: Environmental consultantWalker - 2 wheels;Cane - single point          Prior Functioning/Environment Level of Independence: Independent with assistive device(s)        Comments: Pt reports he has a RW but does not use it because he is afriad he is going to fall. Pt performing ADLs and IADLs. Reports two recent falls        OT Problem List: Decreased strength;Decreased range of motion;Decreased activity tolerance;Impaired balance (sitting and/or standing);Decreased knowledge of use of DME or AE;Decreased knowledge of precautions;Pain      OT Treatment/Interventions: Self-care/ADL training;Therapeutic exercise;Energy conservation;DME and/or AE instruction;Therapeutic activities;Patient/family education    OT Goals(Current goals can be found in the care plan section) Acute Rehab OT Goals Patient Stated Goal: "Go home and stop falling" OT Goal Formulation: With patient Time For Goal Achievement: 03/20/18 Potential to Achieve Goals:  Good ADL Goals Pt Will Perform Lower Body Dressing: with modified independence;sit to/from stand Pt Will Transfer to Toilet: with modified independence;ambulating;regular height toilet Pt Will Perform Toileting - Clothing Manipulation and hygiene: with modified independence;sit to/from stand Pt Will Perform Tub/Shower Transfer: Tub transfer;ambulating;3 in 1;rolling walker;with min guard assist  OT Frequency: Min 2X/week   Barriers to D/C: Decreased caregiver support  Son works       Co-evaluation              AM-PAC PT "6 Clicks" Daily Activity     Outcome Measure Help from another person eating meals?: None Help from another person taking care of personal grooming?: A Little Help from another person toileting, which includes using toliet, bedpan, or urinal?: A Little Help from another person bathing (including washing, rinsing, drying)?: A Little Help from another person to put on and taking off regular upper body clothing?: A Little Help from another person to put on and taking off regular lower body clothing?: A Little 6 Click Score: 19   End of Session Equipment Utilized During Treatment: Rolling walker Nurse Communication: Mobility status  Activity Tolerance: Patient tolerated treatment well;Patient limited by fatigue Patient left: in chair;with call bell/phone within reach;with nursing/sitter in room  OT Visit Diagnosis: Unsteadiness on feet (R26.81);Other abnormalities of gait and mobility (R26.89);Muscle weakness (generalized) (  M62.81)                Time: 1610-9604 OT Time Calculation (min): 29 min Charges:  OT General Charges $OT Visit: 1 Visit OT Evaluation $OT Eval Moderate Complexity: 1 Mod OT Treatments $Self Care/Home Management : 8-22 mins  Khup Sapia MSOT, OTR/L Acute Rehab Pager: [phone] Office: [phone]  Theodoro Grist Armel Rabbani 03/06/2018, 10:39 AM

## 2018-03-06 NOTE — Plan of Care (Signed)
  Problem: Education: Goal: Knowledge of General Education information will improve Description: Including pain rating scale, medication(s)/side effects and non-pharmacologic comfort measures Outcome: Progressing   Problem: Clinical Measurements: Goal: Will remain free from infection Outcome: Progressing Goal: Respiratory complications will improve Outcome: Progressing   

## 2018-03-06 NOTE — Progress Notes (Signed)
No CPAP at bedside- pt decline use.  Machine available if pt requests use of CPAP for night time use.

## 2018-03-06 NOTE — Progress Notes (Signed)
   Subjective:   Mr.Bebo states feels better this morning but still endorsing mild chest pain. Tolerating PO intake well, urinating normally but denied having a bowel movement today. He also had trouble walking to the bathroom. After extensive discussion, he would like to avoid going to skill nursing factility. He agrees to have home nursing and home pt/ot come as long as they have translation services available. The need for patient and his family to reach out to his prior dialysis social worker to change his dialysis seat to [place] in [place] was explained to patient and his nephew. They expressed understanding.  Objective:  Vital signs in last 24 hours: Vitals:   03/05/18 1709 03/05/18 2018 03/05/18 2115 03/06/18 0503  BP: 130/63 114/64  (!) 126/53  Pulse: 66 72 70 66  Resp: 16 20 (!) 23 20  Temp: (!) 97.5 F (36.4 C) 97.7 F (36.5 C)  98.7 F (37.1 C)  TempSrc: Oral Oral  Oral  SpO2: 100% 99% 100% 100%  Weight: 85.9 kg     Height:       Physical Exam  Constitutional: He is well-developed, well-nourished, and in no distress.  Cardiovascular: Normal rate, regular rhythm and normal heart sounds.  No murmur heard. Left sided chest wall tenderness  Pulmonary/Chest: Effort normal and breath sounds normal. No respiratory distress. He has no wheezes. He has no rales. He exhibits tenderness.  Musculoskeletal: He exhibits no edema.  Skin: Skin is warm and dry.    Assessment/Plan:  Principal Problem:   Atypical chest pain Active Problems:   Pressure injury of skin   Obstructive sleep apnea   Type 2 diabetes mellitus with diabetic polyneuropathy, with long-term current use of insulin (HCC)   Atherosclerotic peripheral vascular disease with intermittent claudication (HCC)   ESRD on dialysis [facility](HCC)  Mr. Oswaldo Conroyatino is a [age] y/oSpanish speakingmale with PMHx of insulin-dependent DM, ESRD on HD T/Th/S, HTN, CAD s/p CABG 2016 who presents with 2 weeks of progressive chest pain and  generalized weakness.  Chest pain: Atypical chest pain, reproducible on exam, slightly improved. Troponin's negative. Telemetry monitoring unremarkable.  Repeat lipid panel notable for undetectable LDL, low HDL 11, and total cholesterol of 45. Per chart review, patient has a history of low LDL and HDL. Will decrease atorvastatin 80 mg to 40 mg.  - continue tylenol 650 mg q6h PRN - decrease atorvastatin to 40 mg qd, follow up with pcp  Generalized weakness:  PT recommended SNF placement due to fall risk and to continue to address functional deficits. Patient declined SNF at this time and prefers HHS with translating services.  - HHS with PT/OT and RN ordered - adjust insulin regimen (see below)  DM:  Patient's home medications include glipizide and 30 units of NPH. A1c 7.4. Switching him to linagliptin 5 mg qd and discontinuing glipizide and NPH. - SSI-sensitive   - monitor CBGs - discharge on linagliptin 5 mg QD, discontinue glipizide and NPH  ESRD on HD T/TH/S:  Euvolemic on exam, electrolytes stable. Received HD yesterday. BUN increased to 63, and Cr increased to 9.77.   Dispo: Patient is medically stable for discharge today, pending HHS.  Wilmoth Rasnic N, DO 03/06/2018, 9:22 AM Pager: [phone]

## 2018-03-06 NOTE — Progress Notes (Addendum)
Inpatient Diabetes Program Recommendations  AACE/ADA: New Consensus Statement on Inpatient Glycemic Control (2015)  Target Ranges:  Prepandial:   less than 140 mg/dL      Peak postprandial:   less than 180 mg/dL (1-2 hours)      Critically ill patients:  140 - 180 mg/dL   Lab Results  Component Value Date   GLUCAP 116 (H) 03/06/2018   HGBA1C 7.4 (H) 03/05/2018    Review of Glycemic Control Results for Todd Gill, Todd Gill (MRN [id number]) as of 03/06/2018 11:15  Ref. Range 03/05/2018 16:48 03/05/2018 17:33 03/05/2018 22:20 03/06/2018 06:57  Glucose-Capillary Latest Ref Range: 70 - 99 mg/dL 409109 (H) 811113 (H) 914335 (H) 116 (H)   Diabetes history: Type 2 DM Outpatient Diabetes medications: Glipizide 10 mg QD, NPH 30 units- 20 units QAM, 10 units QPM Current orders for Inpatient glycemic control: Novolog 0-9 units TID, Novolog 0-5 units QHS  Inpatient Diabetes Program Recommendations:    Spoke with patient briefly, using [employer] interpreters, regarding diabetes home management. Verified home medications listed above. Patient endorses feeling bad recently and having experienced a fall.   Reviewed patient's current A1c of 7.4%. Explained what a A1c is and what it measures. Also reviewed goal A1c with patient, importance of good glucose control @ home, and blood sugar goals.  After being asked several times regarding BS monitoring, he asked coordinator to come back and did not want to speak anymore.   Noted changes to outpatient diabetes medications. In agreement. Will continue to follow.   Addendum @1545 : Spoke with patient and god-son regarding outpatient diabetes management.   Reviewed patient's current A1c of 7.4%. Explained what a A1c is and what it measures. Also reviewed goal A1c with patient, importance of good glucose control @ home, and blood sugar goals. Reviewed basic patho of DM, vascular changes that occurs, differences in insulins (short acting vs long acting vs NPH), and survival skills to  include interventions and signs and symptoms for hypoglycemia.   Patient was not checking BS because of physical limitations and lack of help. Per god-son, "It became really hard to manage DM because he was so weak and could not get up to get a snack to bring his blood sugar back up." Informed that god-son is working to get patient moved closer to him, so that he can help with care. Discussed glucose tabs for future use to help this issue (provided Relion information) and also discussed Freestyle libre as an option for continual glucose monitoring in the event insulin is continued. Reiterated that home health would be checking BS, however, per discharge notes that NPH may be discontinued. Reviewed some scenarios regarding BS fluctuations, when to call MD, and goal setting in an attempt to collect BS 1-2 times per day. Encouraged the need for close follow up with PCP given changes and to expect further changes in the future regarding glucose management. Patient and family have no further questions regarding DM at this time.  Thanks, Todd RaveLauren Kennedi Lizardo, MSN, RNC-OB Diabetes Coordinator [phone] (8a-5p)

## 2018-03-06 NOTE — Progress Notes (Signed)
Internal Medicine Attending  Date: 03/06/2018  Patient name: Todd Gill Medical record number: [id number] Date of birth: [birth year] Age: 67 y.o. Gender: male  I saw and evaluated the patient. I reviewed the resident's note by Dr. Karilyn Cotaehman and I agree with the resident's findings and plans as documented in her progress note.  [name] is doing well today. The issue at this point is working out the specifics of his social situation. He is refusing a skilled nursing facility. He would be willing to have home health services were they possible, but it appears there are social issues that may be inhibiting this being done at his home in [place]. His nephew is more than willing to have him stay with him in [place] but the issue remains a dialysis seat in [place]. We explained to the patient and his nephew that they needed to contact the [facility] social worker to start the process of finding a temporary hemodialysis seat in [place]. We are unsure exactly how long this could take, but likely a couple of days. Once he has a temporary hemodialysis seat in [place] he could be discharged to his nephew's home with home health services. This complex social situation has been challenging to arrange for case management. They continue to work on it and in the meantime he will remain in the hospital as discharging him without appropriate outpatient support services will likely result in a readmission.

## 2018-03-06 NOTE — Care Management Note (Addendum)
Case Management Note Donn PieriniKristi Ger Ringenberg RN, BSN Unit 4E- RN Care Coordinator  [phone]  Patient Details  Name: Todd JoyJose C Agnes MRN: [id number] Date of Birth: Dec 11, 1950  Subjective/Objective:  Pt admitted with chest pain, hx ESRD- HD TTS                   Action/Plan: PTA pt lived at home with son, spoke with pt at bedside with interpreter. Per conversation pt states son works during day, pt refusing SNF recommendations- wants someone to come help him at the house- explained to pt that Medicare does not cover someone to come stay with him at home to assist him at home, however [facility] services could come out 2-3 days a week to provide PT/OT/RN to assist pt with rehab needs. Pt states that he still drives- explained that pt could not drive and receive HH services. Pt also reports that he has a rollator at home - need 3n1 - order has been placed for DME. Pt lives in [place]- PCP per pt is Dr. Charlaine DaltonJohn Kizer. When asked pt states that he could maybe stay with Nephew here in [place]- however would need to have is HD changed to a location here- discussed with pt that he would need to contact the social worker at his HD center about tranferring to a local HD center in [place] in order to start that process. Nephew is not here currently- pt reports that he should be back this afternoon to confirm pt's plan and address if pt is to stay here. [facility]- per pt he does not have a preference.  Update- 1440- f/u done with pt- nephew still not at bedside- pt still reports that his plan is to stay with nephew- but need to confirm this with nephew- who per pt should be back between 3-4 today. CM will f/u with nephew when he returns to confirm transition plan and address here in [place] if pt is to stay here.  [facility]- 3n1 to be delivered to room prior to discharge- Referral for HHRN/PT/OT called to Lupita Leashonna with [facility]-   Expected Discharge Date:                   Expected Discharge Plan:  Home w Home Health Services  In-House Referral:  Clinical Social Work  Discharge planning Services  CM Consult  Post Acute Care Choice:  Home Health, Durable Medical Equipment Choice offered to:  Patient  DME Arranged:  3-N-1 DME Agency:  [facility]  HH Arranged:  RN, PT, OT, [facility]:  [facility]  Status of Service:  Completed, signed off  If discussed at Long Length of Stay Meetings, dates discussed:    Additional Comments:  Darrold SpanWebster, Geisha Abernathy Hall, RN 03/06/2018, 2:51 PM

## 2018-03-06 NOTE — Care Management Note (Addendum)
Case Management Note Marvetta Gibbons RN, BSN Unit 4E- RN Care Coordinator  [phone]  Patient Details  Name: Todd Gill MRN: [id number] Date of Birth: 03/21/1951  Subjective/Objective:  Pt admitted with chest pain, hx ESRD- HD TTS                   Action/Plan: PTA pt lived at home with son, spoke with pt at bedside with interpreter. Per conversation pt states son works during day, pt refusing SNF recommendations- wants someone to come help him at the house- explained to pt that Medicare does not cover someone to come stay with him at home to assist him at home, however [facility] services could come out 2-3 days a week to provide PT/OT/RN to assist pt with rehab needs. Pt states that he still drives- explained that pt could not drive and receive [place] services. Pt also reports that he has a rollator at home - need 3n1 - order has been placed for DME. Pt lives in [place]- PCP per pt is Dr. Johny Blamer. When asked pt states that he could maybe stay with Nephew here in [place]- however would need to have is HD changed to a location here- discussed with pt that he would need to contact the social worker at his HD center about tranferring to a local HD center in [place] in order to start that process. Nephew is not here currently- pt reports that he should be back this afternoon to confirm pt's plan and address if pt is to stay here. Choice offered to pt for [facility] agency- per pt he does not have a preference.  Update- 1440- f/u done with pt- nephew still not at bedside- pt still reports that his plan is to stay with nephew- but need to confirm this with nephew- who per pt should be back between 3-4 today. CM will f/u with nephew when he returns to confirm transition plan and address here in [place] if pt is to stay here.  Call made to [facility] with [facility] for DME- 3n1 to be delivered to room prior to discharge- Referral for HHRN/PT/OT called to Butch Penny with [facility]-   Expected Discharge Date:                   Expected Discharge Plan:  [place]  In-House Referral:  Clinical Social Work  Discharge planning Services  CM Consult  Post Acute Care Choice:  Home Health, Durable Medical Equipment Choice offered to:  Patient  DME Arranged:  3-N-1 DME Agency:  [place]:  RN, PT, OT, Refused SNF [facility] Agency: Jackquline Denmark  Status of Service:  Completed, signed off  If discussed at [place] of Stay Meetings, dates discussed:    Additional Comments: 03/06/18 1616-Janell Keeling RNCM-CM met with patient's nephew Charan Prieto to confirm plan for patient to transition home with nephew and nephew's ability to provide assistance. Nephew confirmed being able to assist with post transitional needs and transportation to HD. Nephew indicated patient's scheduled for outpatient HD treatment tomorrow at [facility] HD at 0500, and d/t recent changes in post hospitalization plan, he's unable to transport patient to his scheduled HD slot on 03/07/18. Nephew requesting patient remain in-house for scheduled HD treatment on 03/07/18, then transition home. MD updated and patient will transition home on 03/07/18 as requested. CM provided nephew with [facility] HD contact information to request HD time slot change and request for HD transfer to [facility]. CM confirmed nephew's  address for [facility], with Butch Penny [facility] liaison updated.   Midge Minium RN, BSN, NCM-BC, ACM-RN [phone] 03/06/2018, 4:15 PM

## 2018-03-06 NOTE — Progress Notes (Signed)
Patient ID: Todd Gill, male   DOB: Dec 16, 1950, 67 y.o.   MRN: [id number] [facility] Progress Note   Assessment/ Plan:   1.  Chest pain: Atypical chest pain without evidence of acute coronary syndrome based on initial work-up-suspected to be musculoskeletal in nature and being treated as such. 2.  Generalized weakness:  From multiple factors including general deconditioning for which SNF recommended however patient has declined.  He will be [place] [facility] with HHPT/OT from where he can try and coordinate transient placement for hemodialysis/living here [place] via his social worker at hemodialysis. 3.  End-stage renal disease: Status post hemodialysis yesterday and without any acute indications for dialysis today-possible plans for discharge home with home health physical therapy/Occupational Therapy today.  If he is still here in the hospital tomorrow, will order for hemodialysis. 4.  Hypertension: Blood pressures fairly controlled on antihypertensive therapy/hemodialysis. 5.  Anemia of chronic kidney disease: Hemoglobin level currently within acceptable range, currently off ESA. 6.  Secondary hyperparathyroidism:  Calcium and phosphorus levels within acceptable range, will review records with regards to VDRA/calcimimetic.  Subjective:   Reports some chest discomfort overnight and weakness when trying to ambulate to restroom.  Complains of nausea at this time with intermittent retching.   Objective:   BP (!) 126/53 (BP Location: Right Arm)   Pulse 66   Temp 98.7 F (37.1 C) (Oral)   Resp 20   Ht 5\' 6"  (1.676 m)   Wt 85.9 kg   SpO2 100%   BMI 30.57 kg/m   Physical Exam: Gen: Appears somewhat uncomfortable sitting on the side of his bed CVS: Pulse regular rhythm, normal rate, S1 and S2 normal Resp: Clear to auscultation, no rales/retraction.  Left IJ TDC Abd: Soft, obese, nontender Ext: No lower extremity edema  Labs: BMET Recent Labs  Lab  03/04/18 1901 03/05/18 1300  NA 138 135  K 4.8 4.8  CL 87* 86*  CO2 31 28  GLUCOSE 186* 190*  BUN 53* 63*  CREATININE 8.72* 9.77*  CALCIUM 8.2* 7.7*  PHOS  --  4.3   CBC Recent Labs  Lab 03/04/18 1901 03/05/18 1300  WBC 6.6 8.2  NEUTROABS 4.6  --   HGB 10.7* 10.7*  HCT 36.8* 36.3*  MCV 86.8 85.2  PLT 196 198   Medications:    . carvedilol  6.25 mg Oral BID  . cinacalcet  90 mg Oral Q breakfast  . diclofenac sodium  2 g Topical QID  . fluticasone  2 spray Each Nare Daily  . heparin  5,000 Units Subcutaneous Q8H  . hydrALAZINE  25 mg Oral TID  . insulin aspart  0-5 Units Subcutaneous QHS  . insulin aspart  0-9 Units Subcutaneous TID WC  . polyethylene glycol  17 g Oral Daily  . sevelamer carbonate  2,400 mg Oral TID WC   Zetta BillsJay Valin Massie, MD 03/06/2018, 11:17 AM

## 2018-03-06 NOTE — Clinical Social Work Note (Signed)
Clinical Social Work Assessment  Patient Details  Name: Todd Gill MRN: [id number] Date of Birth: Nov 21, 1950  Date of referral:  03/06/18               Reason for consult:  Discharge Planning, Facility Placement                Permission sought to share information with:  Family Supports Permission granted to share information::  Yes, Verbal Permission Granted  Name::     Devan Danzer  Agency::     Relationship::  [phone]  Contact Information:  son  Housing/Transportation Living arrangements for the past 2 months:  [place] of Information:  Patient Patient Interpreter Needed:  Spanish Criminal Activity/Legal Involvement Pertinent to Current Situation/Hospitalization:  No - Comment as needed Significant Relationships:  Adult Children, Other Family Members Lives with:  Adult Children Do you feel safe going back to the place where you live?  Yes Need for family participation in patient care:  Yes (Comment)  Care giving concerns:  Clinical Social Worker met patient at bedside with RNCM and interpretor present. Patient is engaged during Chief Technology Officer / plan:  Patient stated he lives at home with his son but son works all day. Patient stated he does not want to go to SNF but would prefer home with home health. Patient understand that home health will not be available to him everyday.pateint stated he will reach out to his nephew that lives in [place] and hopes that he can stay with nephew. RNCM aware that patient is refusing SNF placement   Employment status:  Retired Forensic scientist:  Medicare PT Recommendations:    Information / Referral to community resources:  [place]  Patient/Family's Response to care:  Patient not wanting to go to rehab  Patient/Family's Understanding of and Emotional Response to Diagnosis, Current Treatment, and Prognosis:  CSW signing off as patient is refusing rehab  facility  Emotional Assessment Appearance:  Appears stated age Attitude/Demeanor/Rapport:  Engaged Affect (typically observed):  Accepting Orientation:  Oriented to Place, Oriented to Self, Oriented to  Time, Oriented to Situation Alcohol / Substance use:  Not Applicable Psych involvement (Current and /or in the community):     Discharge Needs  Concerns to be addressed:  Care Coordination Readmission within the last 30 days:  No Current discharge risk:  Dependent with Mobility Barriers to Discharge:  No Barriers Identified   Wende Neighbors, LCSW 03/06/2018, 11:33 AM

## 2018-03-07 DIAGNOSIS — M940 Chondrocostal junction syndrome [Tietze]: Principal | ICD-10-CM

## 2018-03-07 LAB — CBC
HEMATOCRIT: 34.2 % — AB (ref 39.0–52.0)
HEMOGLOBIN: 10.2 g/dL — AB (ref 13.0–17.0)
MCH: 25.5 pg — ABNORMAL LOW (ref 26.0–34.0)
MCHC: 29.8 g/dL — AB (ref 30.0–36.0)
MCV: 85.5 fL (ref 78.0–100.0)
Platelets: 207 10*3/uL (ref 150–400)
RBC: 4 MIL/uL — ABNORMAL LOW (ref 4.22–5.81)
RDW: 21.2 % — AB (ref 11.5–15.5)
WBC: 8.3 10*3/uL (ref 4.0–10.5)

## 2018-03-07 LAB — RENAL FUNCTION PANEL
ALBUMIN: 2.6 g/dL — AB (ref 3.5–5.0)
ANION GAP: 16 — AB (ref 5–15)
BUN: 47 mg/dL — AB (ref 8–23)
CALCIUM: 7.2 mg/dL — AB (ref 8.9–10.3)
CO2: 25 mmol/L (ref 22–32)
Chloride: 90 mmol/L — ABNORMAL LOW (ref 98–111)
Creatinine, Ser: 7.49 mg/dL — ABNORMAL HIGH (ref 0.61–1.24)
GFR calc Af Amer: 8 mL/min — ABNORMAL LOW (ref >60)
GFR, EST NON AFRICAN AMERICAN: 7 mL/min — AB (ref >60)
Glucose, Bld: 200 mg/dL — ABNORMAL HIGH (ref 70–99)
PHOSPHORUS: 2.8 mg/dL (ref 2.5–4.6)
POTASSIUM: 4.6 mmol/L (ref 3.5–5.1)
SODIUM: 131 mmol/L — AB (ref 135–145)

## 2018-03-07 LAB — GLUCOSE, CAPILLARY
GLUCOSE-CAPILLARY: 164 mg/dL — AB (ref 70–99)
GLUCOSE-CAPILLARY: 274 mg/dL — AB (ref 70–99)
Glucose-Capillary: 230 mg/dL — ABNORMAL HIGH (ref 70–99)

## 2018-03-07 MED ORDER — LINAGLIPTIN 5 MG PO TABS: 5.0000 mg | ORAL_TABLET | Freq: Every day | ORAL | 0 refills | Status: AC

## 2018-03-07 MED ORDER — FLUTICASONE PROPIONATE 50 MCG/ACT NA SUSP: 2.0000 | Freq: Every day | NASAL | 0 refills | Status: AC

## 2018-03-07 MED ORDER — HEPARIN SODIUM (PORCINE) 1000 UNIT/ML DIALYSIS
40.0000 [IU]/kg | INTRAMUSCULAR | Status: DC | PRN
  Filled 2018-03-07: qty 4

## 2018-03-07 MED ORDER — FLUTICASONE PROPIONATE 50 MCG/ACT NA SUSP: 2.0000 | Freq: Every day | NASAL | 0 refills | Status: DC

## 2018-03-07 MED ORDER — ATORVASTATIN CALCIUM 40 MG PO TABS: 40.0000 mg | ORAL_TABLET | Freq: Every day | ORAL | 0 refills | Status: DC

## 2018-03-07 MED ORDER — SALINE SPRAY 0.65 % NA SOLN: 1.0000 | NASAL | 0 refills | Status: AC | PRN

## 2018-03-07 MED ORDER — ACETAMINOPHEN 325 MG PO TABS: 650.0000 mg | ORAL_TABLET | Freq: Four times a day (QID) | ORAL | Status: AC | PRN

## 2018-03-07 MED ORDER — SALINE SPRAY 0.65 % NA SOLN: 1.0000 | NASAL | 0 refills | Status: DC | PRN

## 2018-03-07 MED ORDER — ACETAMINOPHEN 325 MG PO TABS: 650.0000 mg | ORAL_TABLET | Freq: Four times a day (QID) | ORAL | Status: DC | PRN

## 2018-03-07 MED ORDER — ATORVASTATIN CALCIUM 40 MG PO TABS: 40.0000 mg | ORAL_TABLET | Freq: Every day | ORAL | 0 refills | Status: AC

## 2018-03-07 NOTE — Progress Notes (Signed)
   Subjective: [name] was evaluated during HD this morning. He called his nephew who served as our Nurse, learning disabilitytranslator. He said the only complaint [name] had today was dryness he felt in his throat. He denied any new symptoms and said he felt better than when he came in. All questions and concerns were addressed.  Objective:  Vital signs in last 24 hours: Vitals:   03/07/18 0735 03/07/18 0748 03/07/18 0800 03/07/18 0830  BP: (!) 163/75 (!) 198/80 (!) 167/67 (!) 119/57  Pulse: (!) 58 (!) 56 (!) 59 60  Resp: 18 18 18    Temp: 98.1 F (36.7 C)     TempSrc: Oral     SpO2: 100% 100%    Weight: 88 kg     Height:       Physical Exam  Constitutional: He is well-developed, well-nourished, and in no distress.  Cardiovascular: Normal rate, regular rhythm and normal heart sounds.  No murmur heard. Pulmonary/Chest: Effort normal and breath sounds normal. No respiratory distress.  Musculoskeletal: He exhibits edema.  Skin: Skin is warm and dry.    Assessment/Plan:  Principal Problem:   Atypical chest pain Active Problems:   Pressure injury of skin   Obstructive sleep apnea   Type 2 diabetes mellitus with diabetic polyneuropathy, with long-term current use of insulin (HCC)   Atherosclerotic peripheral vascular disease with intermittent claudication (HCC)   ESRD on dialysis [facility](HCC)  [name] [age] y/[age]Spanish speakingmale with PMHx of insulin-dependent DM, ESRD on HD T/Th/S, HTN, CAD s/p CABG 2016 who presents with 2 weeks of progressive chest painand generalized weakness.  Chest pain: Atypical chest pain, reproducible on exam, slightly improved. Stable. - continue tylenol 650 mg q6h PRN - continue atorvastatin to 40 mg qd, follow up with pcp  Generalized weakness: Patient denied SNF placement and plans to go home with [place]. He was informed to contact a Child psychotherapistsocial worker at his HD center in [place] to help him find a temporary HD center here. Case management is on  board and  coordinate HHS here in [place] at patient's nephew's house. - HHS with PT/OT and RN ordered - adjust insulin regimen (see below)  DM:  Patient's sugars remaining in the 200's. Switching him to linagliptin 5 mg qd at discharge and discontinuing glipizide and NPH. If his sugars continue to remain elevated may need to resume insulin therapy. Recommend close PCP follow up. - SSI-sensitive  -monitor CBGs - discharge on linagliptin 5 mg QD, discontinue glipizide and NPH  ESRD on HD T/TH/S:  HD today.    Dispo: Patient is medically stable for discharge today.   Jaci StandardRehman, Areeg N, DO 03/07/2018, 9:53 AM Pager: [phone]

## 2018-03-07 NOTE — Progress Notes (Signed)
Patient ID: Todd Gill, male   DOB: Nov 04, 1950, 67 y.o.   MRN: [id number] [facility] Progress Note   Assessment/ Plan:   1.  Chest pain:  Without evidence of acute coronary syndrome, continue management per primary service. 2.  Generalized weakness:  From multiple factors including general deconditioning for which SNF recommended however patient has declined.  Disposition plans noted from IMTS progress notes. 3.  End-stage renal disease: Continue hemodialysis on a Tuesday/Thursday/Saturday schedule as we work towards placement back to his home with home health physical therapy/Occupational Therapy and thereafter he can work with his Child psychotherapistsocial worker at his kidney center to try for transient placement here in the [place] area. 4.  Hypertension: Blood pressures fairly controlled on antihypertensive therapy/hemodialysis. 5.  Anemia of chronic kidney disease: Hemoglobin level currently within acceptable range, currently off ESA. 6.  Secondary hyperparathyroidism:  Calcium and phosphorus levels within acceptable range, restarted on Hectorol 4 mcg with every dialysis treatment for PTH suppression.  Subjective:   Reports some discomfort in his throat with coughing and nausea overnight.   Objective:   BP (!) 119/57   Pulse 60   Temp 98.1 F (36.7 C) (Oral)   Resp 18   Ht 5\' 6"  (1.676 m)   Wt 88 kg   SpO2 100%   BMI 31.31 kg/m   Physical Exam: Gen: Appears to be resting comfortably in hemodialysis. CVS: Pulse regular rhythm, normal rate, S1 and S2 normal Resp: Clear to auscultation, no rales/retraction.  Left IJ TDC Abd: Soft, obese, nontender Ext: No lower extremity edema  Labs: BMET Recent Labs  Lab 03/04/18 1901 03/05/18 1300 03/07/18 0759  NA 138 135 131*  K 4.8 4.8 4.6  CL 87* 86* 90*  CO2 31 28 25   GLUCOSE 186* 190* 200*  BUN 53* 63* 47*  CREATININE 8.72* 9.77* 7.49*  CALCIUM 8.2* 7.7* 7.2*  PHOS  --  4.3 2.8   CBC Recent Labs  Lab 03/04/18 1901  03/05/18 1300 03/07/18 0800  WBC 6.6 8.2 8.3  NEUTROABS 4.6  --   --   HGB 10.7* 10.7* 10.2*  HCT 36.8* 36.3* 34.2*  MCV 86.8 85.2 85.5  PLT 196 198 207   Medications:    . carvedilol  6.25 mg Oral BID  . cinacalcet  90 mg Oral Q breakfast  . diclofenac sodium  2 g Topical QID  . doxercalciferol  4 mcg Intravenous Q T,Th,Sa-HD  . fluticasone  2 spray Each Nare Daily  . heparin  5,000 Units Subcutaneous Q8H  . hydrALAZINE  25 mg Oral TID  . insulin aspart  0-5 Units Subcutaneous QHS  . insulin aspart  0-9 Units Subcutaneous TID WC  . polyethylene glycol  17 g Oral Daily  . sevelamer carbonate  2,400 mg Oral TID WC   Zetta BillsJay Persephone Schriever, MD 03/07/2018, 10:50 AM

## 2018-03-07 NOTE — Progress Notes (Signed)
Patient is ready for discharge. He is alert and oriented. I have gone over all discharge instructions with the patient and his nephew, Elita QuickJose who speaks english. Patient has all of his belongings. He is alert and oriented. IV has been removed without complication and catheter intact. Patient will discharge home with his nephew [place]. He will be transported home by his nephew. He will leave the unit by wheelchair and meet his nephew at the front entrance of the hospital.

## 2018-03-07 NOTE — Procedures (Signed)
Patient seen on Hemodialysis. QB 400, UF goal 2.5L Treatment adjusted as needed.  Zetta BillsJay [place] Tiger MD [facility]. Office # [phone] Pager # [phone] 8:50 AM

## 2018-03-07 NOTE — Progress Notes (Signed)
PT Cancellation Note  Patient Details Name: Todd Gill MRN: [id number] DOB: 1951/03/25   Cancelled Treatment:     Attempted to see patient, at HD, will cont to follow.   Todd Gill, PT, DPT Acute Rehab Services Pager: [phone]     Todd Gill 03/07/2018, 10:52 AM

## 2018-03-07 NOTE — Progress Notes (Signed)
Internal Medicine Attending  Date: 03/07/2018  Patient name: Todd Gill Medical record number: [id number] Date of birth: 18-Mar-1951 Age: 67 y.o. Gender: male  I saw and evaluated the patient. I reviewed the resident's note by Dr. Karilyn Cotaehman and I agree with the resident's findings and plans as documented in her progress note.  When seen on rounds this morning [name] was in hemodialysis and his only complaint was a dry throat. Home health services have been set up at his nephew's house [place]. His nephew will try to coordinate with his [place] hemodialysis center to find a temporary seat in [place]. He is stable for discharge home with his nephew today.

## 2018-03-07 NOTE — Progress Notes (Signed)
Patient arrived from dialysis. He is alert and oriented. Patient complains of a headache, will get him something for pain. VS are stable, B/P 124/52 (74) Pulse 66, RR 20. Patient ate his lunch and is resting comfortably in bed. Will continue to monitor.

## 2018-03-09 ENCOUNTER — Emergency Department (HOSPITAL_COMMUNITY)
Admission: EM | Admit: 2018-03-09 | Discharge: 2018-03-09 | Disposition: A | Discharge status: Home / Self Care | Payer: Medicare Other | Attending: Emergency Medicine | Admitting: Emergency Medicine

## 2018-03-09 ENCOUNTER — Encounter (HOSPITAL_COMMUNITY): Payer: Self-pay | Admitting: Emergency Medicine

## 2018-03-09 ENCOUNTER — Emergency Department (HOSPITAL_COMMUNITY): Payer: Medicare Other

## 2018-03-09 ENCOUNTER — Other Ambulatory Visit: Payer: Self-pay

## 2018-03-09 DIAGNOSIS — I2510 Atherosclerotic heart disease of native coronary artery without angina pectoris: Secondary | ICD-10-CM | POA: Diagnosis not present

## 2018-03-09 DIAGNOSIS — Z79899 Other long term (current) drug therapy: Secondary | ICD-10-CM | POA: Insufficient documentation

## 2018-03-09 DIAGNOSIS — R0602 Shortness of breath: Secondary | ICD-10-CM | POA: Diagnosis present

## 2018-03-09 DIAGNOSIS — N186 End stage renal disease: Secondary | ICD-10-CM | POA: Diagnosis not present

## 2018-03-09 DIAGNOSIS — Z87891 Personal history of nicotine dependence: Secondary | ICD-10-CM | POA: Insufficient documentation

## 2018-03-09 DIAGNOSIS — E119 Type 2 diabetes mellitus without complications: Secondary | ICD-10-CM | POA: Diagnosis not present

## 2018-03-09 DIAGNOSIS — I120 Hypertensive chronic kidney disease with stage 5 chronic kidney disease or end stage renal disease: Secondary | ICD-10-CM | POA: Insufficient documentation

## 2018-03-09 LAB — DIFFERENTIAL
ABS IMMATURE GRANULOCYTES: 0.1 10*3/uL (ref 0.0–0.1)
BASOS ABS: 0.1 10*3/uL (ref 0.0–0.1)
Basophils Relative: 1 %
EOS ABS: 0.2 10*3/uL (ref 0.0–0.7)
Eosinophils Relative: 2 %
Immature Granulocytes: 1 %
Lymphocytes Relative: 6 %
Lymphs Abs: 0.8 10*3/uL (ref 0.7–4.0)
MONOS PCT: 7 %
Monocytes Absolute: 0.8 10*3/uL (ref 0.1–1.0)
Neutro Abs: 10.8 10*3/uL — ABNORMAL HIGH (ref 1.7–7.7)
Neutrophils Relative %: 83 %

## 2018-03-09 LAB — BASIC METABOLIC PANEL
Anion gap: 19 — ABNORMAL HIGH (ref 5–15)
BUN: 50 mg/dL — ABNORMAL HIGH (ref 8–23)
CHLORIDE: 92 mmol/L — AB (ref 98–111)
CO2: 20 mmol/L — AB (ref 22–32)
Calcium: 8.4 mg/dL — ABNORMAL LOW (ref 8.9–10.3)
Creatinine, Ser: 7.31 mg/dL — ABNORMAL HIGH (ref 0.61–1.24)
GFR, EST AFRICAN AMERICAN: 8 mL/min — AB (ref >60)
GFR, EST NON AFRICAN AMERICAN: 7 mL/min — AB (ref >60)
GLUCOSE: 147 mg/dL — AB (ref 70–99)
POTASSIUM: 5.2 mmol/L — AB (ref 3.5–5.1)
Sodium: 131 mmol/L — ABNORMAL LOW (ref 135–145)

## 2018-03-09 LAB — CBC
HCT: 39.1 % (ref 39.0–52.0)
HEMOGLOBIN: 11.2 g/dL — AB (ref 13.0–17.0)
MCH: 25.5 pg — ABNORMAL LOW (ref 26.0–34.0)
MCHC: 28.6 g/dL — ABNORMAL LOW (ref 30.0–36.0)
MCV: 89.1 fL (ref 78.0–100.0)
Platelets: 264 10*3/uL (ref 150–400)
RBC: 4.39 MIL/uL (ref 4.22–5.81)
RDW: 22.3 % — ABNORMAL HIGH (ref 11.5–15.5)
WBC: 13.2 10*3/uL — AB (ref 4.0–10.5)

## 2018-03-09 LAB — I-STAT TROPONIN, ED: Troponin i, poc: 0.03 ng/mL (ref 0.00–0.08)

## 2018-03-09 LAB — HEPATIC FUNCTION PANEL
ALT: 13 U/L (ref 0–44)
AST: 12 U/L — AB (ref 15–41)
Albumin: 2.9 g/dL — ABNORMAL LOW (ref 3.5–5.0)
Alkaline Phosphatase: 155 U/L — ABNORMAL HIGH (ref 38–126)
BILIRUBIN DIRECT: 0.1 mg/dL (ref 0.0–0.2)
BILIRUBIN INDIRECT: 0.7 mg/dL (ref 0.3–0.9)
TOTAL PROTEIN: 7.3 g/dL (ref 6.5–8.1)
Total Bilirubin: 0.8 mg/dL (ref 0.3–1.2)

## 2018-03-09 LAB — LIPASE, BLOOD: LIPASE: 27 U/L (ref 11–51)

## 2018-03-09 MED ORDER — ALBUTEROL SULFATE (2.5 MG/3ML) 0.083% IN NEBU
5.0000 mg | INHALATION_SOLUTION | Freq: Once | RESPIRATORY_TRACT | Status: DC
  Filled 2018-03-09: qty 6

## 2018-03-09 NOTE — Discharge Instructions (Addendum)
Go to your dialysis center now and have your dialysis today

## 2018-03-09 NOTE — ED Notes (Signed)
Lab reports adding on differential and hepatic function panel.

## 2018-03-09 NOTE — ED Provider Notes (Signed)
[facility] EMERGENCY DEPARTMENT Provider Note   CSN: [id number] Arrival date & time: 03/09/18  [id number]     History   Chief Complaint Chief Complaint  Patient presents with  . Shortness of Breath    HPI Todd Gill is a 67 y.o. male.  Patient states that he feels short of breath.  He supposed to get dialysis today.  The history is provided by the patient. No language interpreter was used.  Shortness of Breath  This is a new problem. The problem occurs intermittently.The current episode started 6 to 12 hours ago. The problem has not changed since onset.Pertinent negatives include no fever, no headaches, no cough, no chest pain, no abdominal pain and no rash. It is unknown what precipitated the problem. He has tried nothing for the symptoms. The treatment provided no relief. He has had no prior hospitalizations. Associated medical issues do not include asthma.    Past Medical History:  Diagnosis Date  . Coronary artery disease   . Daily headache   . End stage renal disease on dialysis (HCC)    "[place] [facility] [place]" (03/05/2018)  . GERD (gastroesophageal reflux disease)   . High cholesterol   . Hypertension   . Stroke (HCC) 12/2016   denies residual on 03/05/2018  . Type II diabetes mellitus [facility](HCC)     Patient Active Problem List   Diagnosis Date Noted  . Pressure injury of skin 03/05/2018  . ESRD on dialysis (HCC) 03/05/2018  . Atypical chest pain   . Obstructive sleep apnea   . Type 2 diabetes mellitus with diabetic polyneuropathy, with long-term current use of insulin (HCC)   . Atherosclerotic peripheral vascular disease with intermittent claudication [facility](HCC)     Past Surgical History:  Procedure Laterality Date  . APPENDECTOMY    . AV FISTULA PLACEMENT Left    "ended up taking it out"  . CARDIAC CATHETERIZATION  2015  . CORONARY ARTERY BYPASS GRAFT  10/2014   "CABG X3-4"  . EYE SURGERY Bilateral    "once to control bleeding; once to  put contacts in" (03/05/2018)  . FINGER AMPUTATION Left 2000s   4th & 5th digits  . INSERTION OF DIALYSIS CATHETER Left 2018  . LIGATION OF ARTERIOVENOUS  FISTULA Left         Home Medications    Prior to Admission medications   Medication Sig Start Date End Date Taking? Authorizing Provider  acetaminophen (TYLENOL) 325 MG tablet Take 2 tablets (650 mg total) by mouth every 6 (six) hours as needed for mild pain or moderate pain. 03/07/18 04/06/18  Rehman, Areeg N, DO  atorvastatin (LIPITOR) 40 MG tablet Take 1 tablet (40 mg total) by mouth daily. 03/07/18 04/06/18  Rehman, Areeg N, DO  carvedilol (COREG) 6.25 MG tablet Take 6.25 mg by mouth 2 (two) times daily. 12/24/17   [provider]  cinacalcet (SENSIPAR) 90 MG tablet Take 90 mg by mouth daily. 12/24/17   [provider]  fluticasone (FLONASE) 50 MCG/ACT nasal spray Place 2 sprays into both nostrils daily. 03/07/18 03/07/19  Rehman, Areeg N, DO  hydrALAZINE (APRESOLINE) 25 MG tablet Take 25 mg by mouth 3 (three) times daily. 01/23/18   [provider]  linagliptin (TRADJENTA) 5 MG TABS tablet Take 1 tablet (5 mg total) by mouth daily. 03/07/18   Rehman, Areeg N, DO  linagliptin (TRADJENTA) 5 MG TABS tablet Take 1 tablet (5 mg total) by mouth daily. 03/07/18   Rehman, Areeg Dorris CarnesN,  DO  sevelamer carbonate (RENVELA) 800 MG tablet Take 2,400 mg by mouth 3 (three) times daily. 12/24/17 12/24/18  [provider]  sodium chloride (OCEAN) 0.65 % SOLN nasal spray Place 1 spray into both nostrils as needed for congestion. 03/07/18 04/06/18  Rehman, Areeg Dorris Carnes, DO    Family History No family history on file.  Social History Social History   Tobacco Use  . Smoking status: Former Smoker    Packs/day: 1.00    Years: 20.00    Pack years: 20.00    Types: Cigarettes  . Smokeless tobacco: Never Used  . Tobacco comment: 03/05/2018 "quit before 2000"  Substance Use Topics  . Alcohol use: Not Currently  . 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   Review of Systems Review of Systems  Constitutional: Negative for appetite change, fatigue and fever.  HENT: Negative for congestion, ear discharge and sinus pressure.   Eyes: Negative for discharge.  Respiratory: Positive for shortness of breath. Negative for cough.   Cardiovascular: Negative for chest pain.  Gastrointestinal: Negative for abdominal pain and diarrhea.  Genitourinary: Negative for frequency and hematuria.  Musculoskeletal: Negative for back pain.  Skin: Negative for rash.  Neurological: Negative for seizures and headaches.  Psychiatric/Behavioral: Negative for hallucinations.     Physical Exam Updated Vital Signs BP (!) 179/65   Pulse 69   Temp 97.8 F (36.6 C) (Oral)   Resp 16   SpO2 98%   Physical Exam  Constitutional: He is oriented to person, place, and time. He appears well-developed.  HENT:  Head: Normocephalic.  Eyes: Conjunctivae and EOM are normal. No scleral icterus.  Neck: Neck supple. No thyromegaly present.  Cardiovascular: Normal rate and regular rhythm. Exam reveals no gallop and no friction rub.  No murmur heard. Pulmonary/Chest: No stridor. He has no wheezes. He has no rales. He exhibits no tenderness.  Abdominal: He exhibits no distension. There is no tenderness. There is no rebound.  Musculoskeletal: Normal range of motion. He exhibits no edema.  Lymphadenopathy:    He has no cervical adenopathy.  Neurological: He is oriented to person, place, and time. He exhibits normal muscle tone. Coordination normal.  Skin: No rash noted. No erythema.  Psychiatric: He has a normal mood and affect. His behavior is normal.     ED Treatments / Results  Labs (all labs ordered are listed, but only abnormal results are displayed) Labs Reviewed  BASIC METABOLIC PANEL - Abnormal; Notable for the following  components:      Result Value   Sodium 131 (*)    Potassium 5.2 (*)    Chloride 92 (*)    CO2 20 (*)    Glucose, Bld 147 (*)    BUN 50 (*)    Creatinine, Ser 7.31 (*)    Calcium 8.4 (*)    GFR calc non Af Amer 7 (*)    GFR calc Af Amer 8 (*)    Anion gap 19 (*)    All other components within normal limits  CBC - Abnormal; Notable for the following components:   WBC 13.2 (*)    Hemoglobin 11.2 (*)    MCH 25.5 (*)    MCHC 28.6 (*)    RDW 22.3 (*)    All other components within normal limits  DIFFERENTIAL - Abnormal; Notable for the  following components:   Neutro Abs 10.8 (*)    All other components within normal limits  HEPATIC FUNCTION PANEL - Abnormal; Notable for the following components:   Albumin 2.9 (*)    AST 12 (*)    Alkaline Phosphatase 155 (*)    All other components within normal limits  LIPASE, BLOOD  I-STAT TROPONIN, ED    EKG None  Radiology Dg Chest 2 View  Result Date: 03/09/2018 CLINICAL DATA:  Dialysis patient with flank pain onset of bilateral and swelling. Shortness of breath. EXAM: CHEST - 2 VIEW COMPARISON:  PA and lateral chest 03/04/2018. FINDINGS: Left IJ approach dialysis catheter is again seen. The patient is status post CABG. Heart size is mildly enlarged. Lungs are clear. No pneumothorax or pleural effusion. Aortic atherosclerosis noted. No acute or focal bony abnormality. IMPRESSION: No acute disease. Cardiomegaly. Atherosclerosis. Electronically Signed   By: Drusilla Kanner M.D.   On: 03/09/2018 08:04    Procedures Procedures (including critical care time)  Medications Ordered in ED Medications - No data to display   Initial Impression / Assessment and Plan / ED Course  I have reviewed the triage vital signs and the nursing notes.  Pertinent labs & imaging results that were available during my care of the patient were reviewed by me and considered in my medical decision making (see chart for details).     Labs show potassium is  slightly elevated 5.2 chest x-ray unremarkable.  Patient respirations are normal and pulse ox is normal.  He is stable to go to dialysis.  I instructed his son to take him to his dialysis center now  Final Clinical Impressions(s) / ED Diagnoses   Final diagnoses:  SOB (shortness of breath)    ED Discharge Orders    None       Bethann Berkshire, MD 03/09/18 [phone]

## 2018-03-09 NOTE — ED Triage Notes (Signed)
Pt presents with bilateral flank pain, swelling, shob. Pt is Tues, Thurs, Sat dialysis with chest port access. Pt has no chest pain. States he is unfamiliar with his medications but is also non compliant with taking them.

## 2018-03-09 NOTE — ED Notes (Signed)
Patient transported to X-ray 

## 2018-03-30 ENCOUNTER — Other Ambulatory Visit: Payer: Self-pay | Admitting: Internal Medicine

## 2018-04-24 ENCOUNTER — Other Ambulatory Visit: Payer: Self-pay | Admitting: Internal Medicine

## 2018-04-27 ENCOUNTER — Other Ambulatory Visit: Payer: Self-pay | Admitting: Internal Medicine

## 2019-02-01 IMAGING — DX DG CHEST 2V
2 series · 2 of 2 positions shown · non-contrast
Comparison: PA and lateral chest 03/04/2018.

CLINICAL DATA: Dialysis patient with flank pain onset of bilateral
and swelling. Shortness of breath.

EXAM:
CHEST - 2 VIEW

[chest pa]
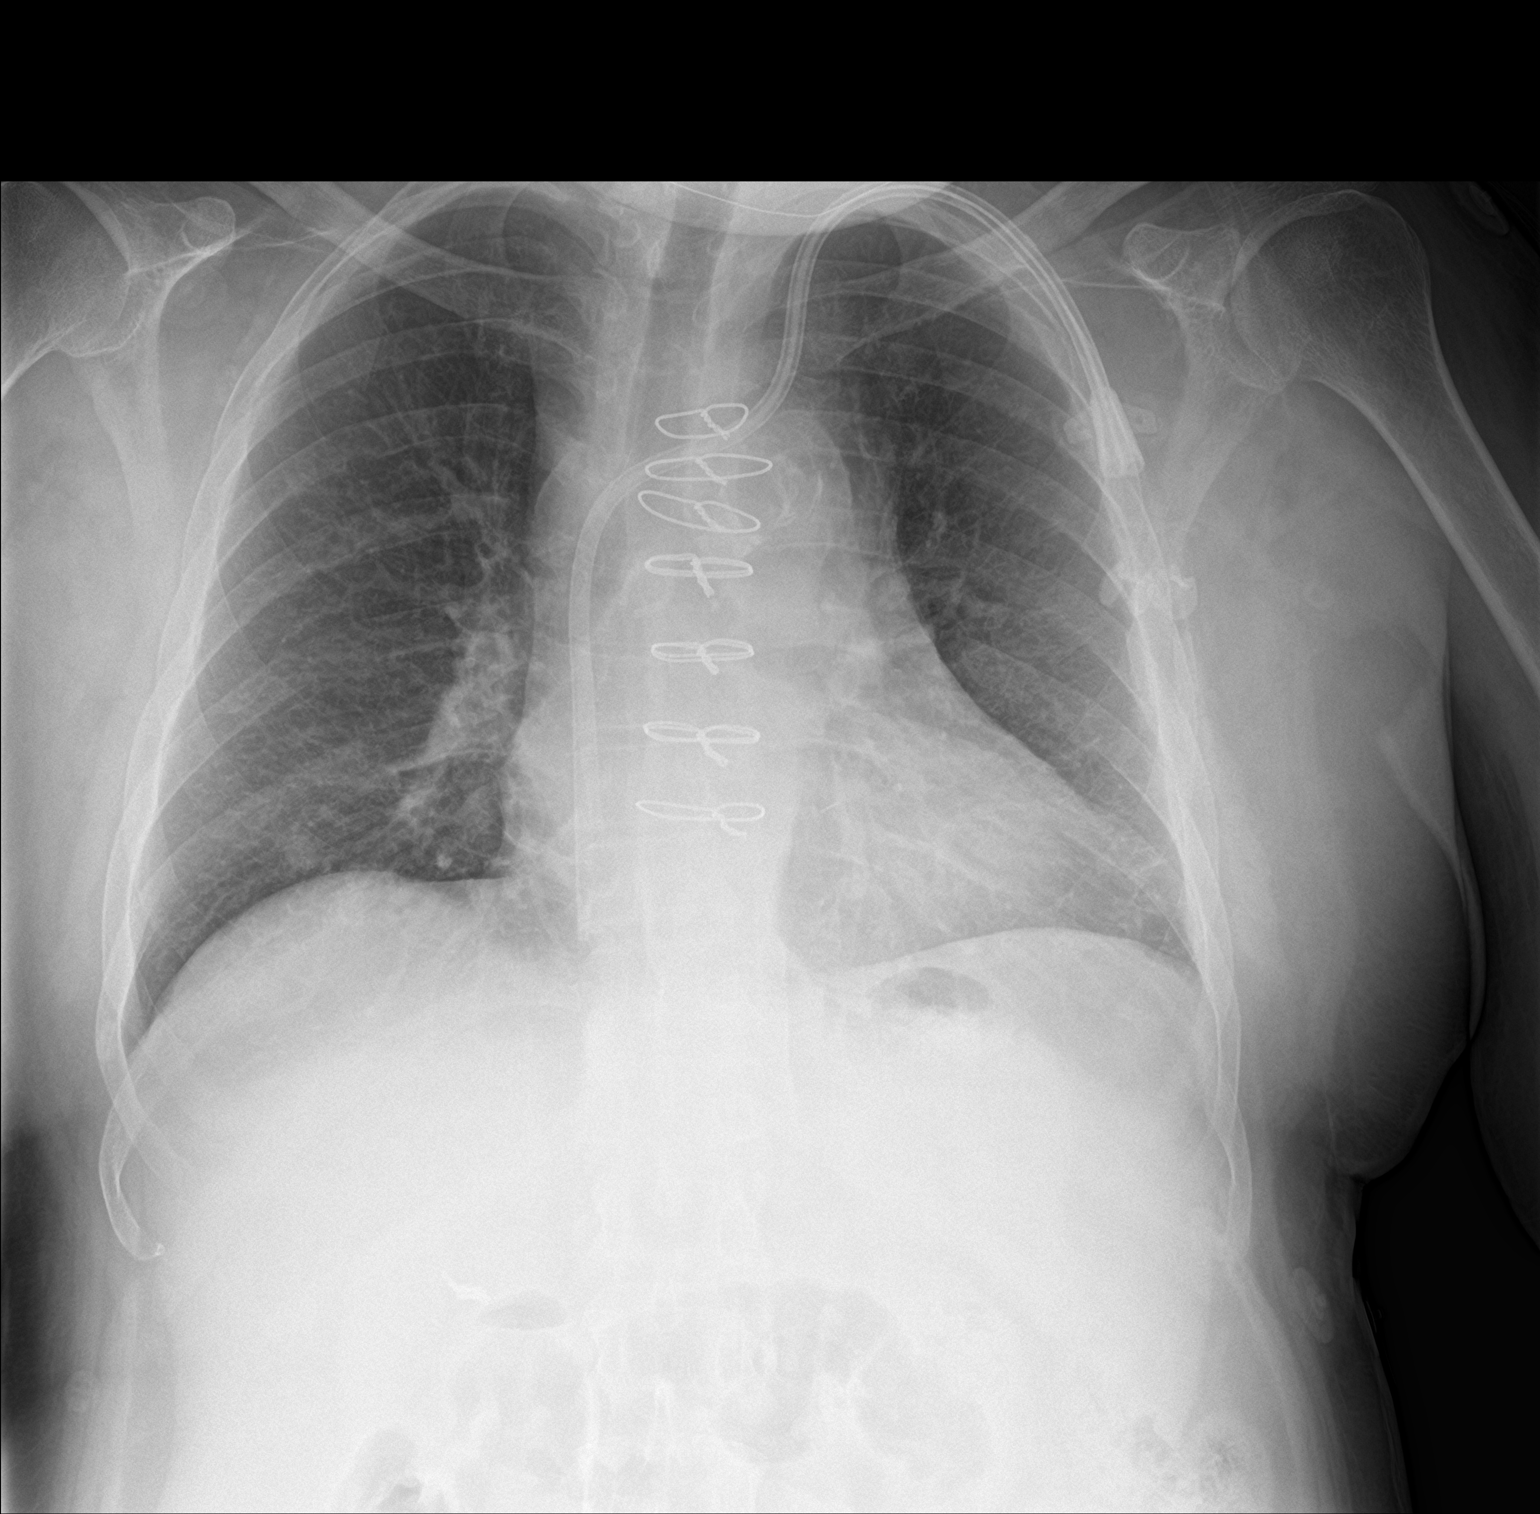

[chest lat]
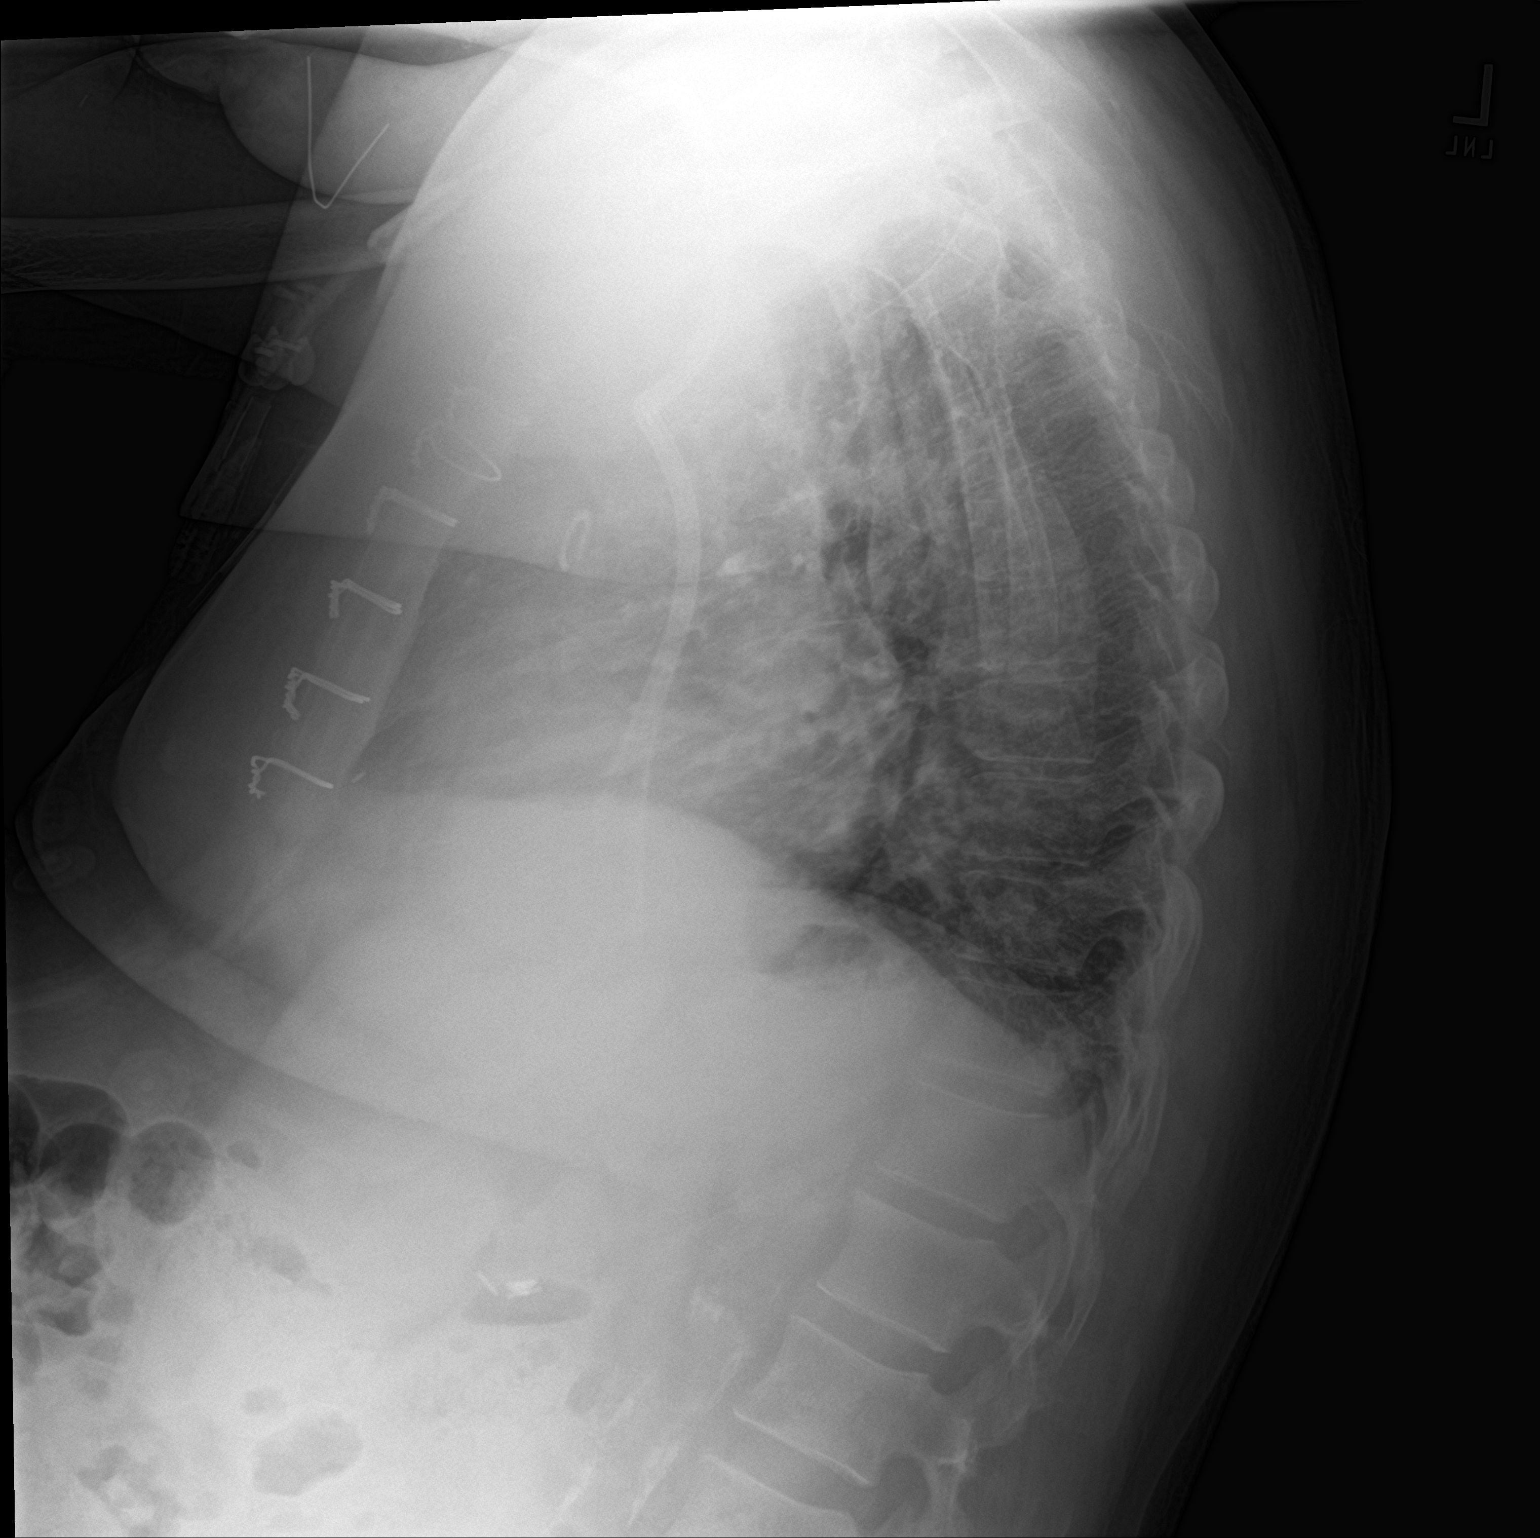

[2 of 2 positions shown; findings below may reference images not displayed]

FINDINGS: Left IJ approach dialysis catheter is again seen. The patient is
status post CABG. Heart size is mildly enlarged. Lungs are clear. No
pneumothorax or pleural effusion. Aortic atherosclerosis noted. No
acute or focal bony abnormality.
IMPRESSION: No acute disease.

Cardiomegaly.

Atherosclerosis.
